# Patient Record
Sex: Male | Born: 1970
Health system: Southern US, Community
[De-identification: ages and names within clinical notes are randomized; demographics above are authoritative.]

## PROBLEM LIST (undated history)

## (undated) DIAGNOSIS — N529 Male erectile dysfunction, unspecified: Secondary | ICD-10-CM

## (undated) DIAGNOSIS — I10 Essential (primary) hypertension: Secondary | ICD-10-CM

## (undated) DIAGNOSIS — N189 Chronic kidney disease, unspecified: Secondary | ICD-10-CM

## (undated) DIAGNOSIS — W3400XA Accidental discharge from unspecified firearms or gun, initial encounter: Secondary | ICD-10-CM

## (undated) HISTORY — DX: Accidental discharge from unspecified firearms or gun, initial encounter: W34.00XA

## (undated) HISTORY — DX: Male erectile dysfunction, unspecified: N52.9

## (undated) HISTORY — PX: NO PAST SURGERIES: SHX2092

---

## 1999-03-03 ENCOUNTER — Emergency Department (HOSPITAL_COMMUNITY): Admission: EM | Admit: 1999-03-03 | Discharge: 1999-03-03 | Payer: Self-pay | Admitting: Emergency Medicine

## 2000-10-20 ENCOUNTER — Emergency Department (HOSPITAL_COMMUNITY): Admission: EM | Admit: 2000-10-20 | Discharge: 2000-10-20 | Payer: Self-pay | Admitting: Emergency Medicine

## 2004-06-05 ENCOUNTER — Emergency Department (HOSPITAL_COMMUNITY): Admission: EM | Admit: 2004-06-05 | Discharge: 2004-06-05 | Payer: Self-pay | Admitting: Emergency Medicine

## 2012-07-17 ENCOUNTER — Emergency Department (HOSPITAL_COMMUNITY)
Admission: EM | Admit: 2012-07-17 | Discharge: 2012-07-18 | Disposition: A | Discharge status: Home / Self Care | Payer: Self-pay | Attending: Emergency Medicine | Admitting: Emergency Medicine

## 2012-07-17 DIAGNOSIS — F17200 Nicotine dependence, unspecified, uncomplicated: Secondary | ICD-10-CM | POA: Insufficient documentation

## 2012-07-17 DIAGNOSIS — L739 Follicular disorder, unspecified: Secondary | ICD-10-CM

## 2012-07-17 DIAGNOSIS — L678 Other hair color and hair shaft abnormalities: Secondary | ICD-10-CM | POA: Insufficient documentation

## 2012-07-17 DIAGNOSIS — I10 Essential (primary) hypertension: Secondary | ICD-10-CM | POA: Insufficient documentation

## 2012-07-17 DIAGNOSIS — L738 Other specified follicular disorders: Secondary | ICD-10-CM | POA: Insufficient documentation

## 2012-07-18 ENCOUNTER — Encounter (HOSPITAL_COMMUNITY): Payer: Self-pay | Admitting: Family Medicine

## 2012-07-18 MED ORDER — DIPHENHYDRAMINE HCL 25 MG PO CAPS: 25.0000 mg | ORAL_CAPSULE | Freq: Four times a day (QID) | ORAL | Status: DC | PRN

## 2012-07-18 MED ORDER — CEPHALEXIN 250 MG PO CAPS
250.0000 mg | ORAL_CAPSULE | Freq: Once | ORAL | Status: AC
  Administered 2012-07-18: 250 mg via ORAL
  Filled 2012-07-18: qty 1

## 2012-07-18 MED ORDER — DIPHENHYDRAMINE HCL 25 MG PO CAPS
25.0000 mg | ORAL_CAPSULE | Freq: Once | ORAL | Status: AC
  Administered 2012-07-18: 25 mg via ORAL
  Filled 2012-07-18: qty 1

## 2012-07-18 MED ORDER — CEPHALEXIN 250 MG PO CAPS: 250.0000 mg | ORAL_CAPSULE | Freq: Three times a day (TID) | ORAL | Status: DC

## 2012-07-18 NOTE — ED Notes (Signed)
Patient states that he thinks he was bitten by something. States he had a bump a back on his back a week ago. Tonight he noticed that he has a bump on his left side and to his right inner thigh. States areas burn and itch.

## 2012-07-18 NOTE — ED Notes (Signed)
Pt states his BP has been high for past 3 years. He would like a follow up MD for BP

## 2012-07-18 NOTE — ED Provider Notes (Signed)
Medical screening examination/treatment/procedure(s) were performed by non-physician practitioner and as supervising physician I was immediately available for consultation/collaboration.  John-Adam Telesia Ates, M.D.  John-Adam Rodnisha Blomgren, MD 07/18/12 0401 

## 2012-07-18 NOTE — ED Provider Notes (Signed)
   History    CSN: [id number] Arrival date & time 07/17/12  2253  First MD Initiated Contact with Patient 07/18/12 0249     Chief Complaint  Patient presents with  . Rash   (Consider location/radiation/quality/duration/timing/severity/associated sxs/prior Treatment) HPI Comments: This 42 year old male presents with a rash, it itchy, burning, and spreading in his inner thighs, groin, abdomen, and left flank is been going on for several weeks, just getting worse.  He has not taken any over-the-counter medication for symptom relief He, states he has not been around any animals.  He does not work outside of the house.  Patient is a 42 y.o. male presenting with rash. The history is provided by the patient.  Rash Pain location:  Suprapubic and periumbilical Pain quality: burning   Pain severity:  Mild Timing:  Constant Progression:  Worsening Associated symptoms: no chest pain, no fever and no shortness of breath    History reviewed. No pertinent past medical history. History reviewed. No pertinent past surgical history. No family history on file. History  Substance Use Topics  . Smoking status: Current Every Day Smoker -- 1.00 packs/day    Types: Cigarettes  . Smokeless tobacco: Not on file  . Alcohol Use: Yes     Comment: "A lot"    Review of Systems  Constitutional: Negative for fever.  Respiratory: Negative for shortness of breath.   Cardiovascular: Negative for chest pain.  Skin: Positive for rash. Negative for wound.  All other systems reviewed and are negative.    Allergies  Review of patient's allergies indicates no known allergies.  Home Medications   Current Outpatient Rx  Name  Route  Sig  Dispense  Refill  . cephALEXin (KEFLEX) 250 MG capsule   Oral   Take 1 capsule (250 mg total) by mouth 3 (three) times daily.   29 capsule   0   . diphenhydrAMINE (BENADRYL) 25 mg capsule   Oral   Take 1 capsule (25 mg total) by mouth every 6 (six) hours as needed for  itching.   30 capsule   0    BP 201/134  Pulse 64  Temp(Src) 98.6 F (37 C) (Oral)  Resp 16  Ht 5\' 9"  (1.753 m)  Wt 160 lb (72.576 kg)  BMI 23.62 kg/m2  SpO2 100% Physical Exam  Nursing note and vitals reviewed. Constitutional: He is oriented to person, place, and time. He appears well-developed and well-nourished.  HENT:  Head: Normocephalic.  Eyes: Pupils are equal, round, and reactive to light.  Neck: Normal range of motion.  Cardiovascular: Normal rate.   Pulmonary/Chest: Effort normal.  Abdominal: Soft.  Musculoskeletal: Normal range of motion. He exhibits no edema and no tenderness.  Neurological: He is alert and oriented to person, place, and time.  Skin: Rash noted.  The rash appears to be small, raised, red areas at the end of hair shafts consistent with a folliculitis    ED Course  Procedures (including critical care time) Labs Reviewed - No data to display No results found. 1. Folliculitis     MDM   We'll treat with Keflex by mouth 3 times a day for 10 days, as well as Benadryl for symptom  Arman Filter, NP 07/18/12 0345  Arman Filter, NP 07/18/12 0348  Arman Filter, NP 07/18/12 0348  Arman Filter, NP 07/18/12 [phone]

## 2012-07-28 ENCOUNTER — Emergency Department (HOSPITAL_COMMUNITY)
Admission: EM | Admit: 2012-07-28 | Discharge: 2012-07-28 | Disposition: A | Discharge status: Home / Self Care | Payer: Self-pay | Attending: Emergency Medicine | Admitting: Emergency Medicine

## 2012-07-28 ENCOUNTER — Encounter (HOSPITAL_COMMUNITY): Payer: Self-pay | Admitting: Emergency Medicine

## 2012-07-28 DIAGNOSIS — I10 Essential (primary) hypertension: Secondary | ICD-10-CM | POA: Insufficient documentation

## 2012-07-28 DIAGNOSIS — F17200 Nicotine dependence, unspecified, uncomplicated: Secondary | ICD-10-CM | POA: Insufficient documentation

## 2012-07-28 DIAGNOSIS — Z79899 Other long term (current) drug therapy: Secondary | ICD-10-CM | POA: Insufficient documentation

## 2012-07-28 LAB — URINALYSIS, ROUTINE W REFLEX MICROSCOPIC
Bilirubin Urine: NEGATIVE
Glucose, UA: NEGATIVE mg/dL
Hgb urine dipstick: NEGATIVE
Ketones, ur: NEGATIVE mg/dL
Leukocytes, UA: NEGATIVE
Nitrite: NEGATIVE
Protein, ur: NEGATIVE mg/dL
Specific Gravity, Urine: 1.014 (ref 1.005–1.030)
Urobilinogen, UA: 0.2 mg/dL (ref 0.0–1.0)
pH: 6 (ref 5.0–8.0)

## 2012-07-28 LAB — POCT I-STAT, CHEM 8
BUN: 11 mg/dL (ref 6–23)
Calcium, Ion: 1.19 mmol/L (ref 1.12–1.23)
Chloride: 106 mEq/L (ref 96–112)
Creatinine, Ser: 1.2 mg/dL (ref 0.50–1.35)
Glucose, Bld: 74 mg/dL (ref 70–99)
HCT: 44 % (ref 39.0–52.0)
Hemoglobin: 15 g/dL (ref 13.0–17.0)
Potassium: 4.1 mEq/L (ref 3.5–5.1)
Sodium: 143 mEq/L (ref 135–145)
TCO2: 26 mmol/L (ref 0–100)

## 2012-07-28 MED ORDER — HYDROCHLOROTHIAZIDE 25 MG PO TABS: 25.0000 mg | ORAL_TABLET | Freq: Every day | ORAL | Status: DC

## 2012-07-28 NOTE — Progress Notes (Signed)
[facility] provided patient with a [employer] and a list of primary care resources.

## 2012-07-28 NOTE — ED Provider Notes (Signed)
  CSN: [id number]     Arrival date & time 07/28/12  1136 History     First MD Initiated Contact with Patient 07/28/12 1301     Chief Complaint  Patient presents with  . Hypertension   (Consider location/radiation/quality/duration/timing/severity/associated sxs/prior Treatment) HPI Pt presenting with c/o hypertension.  He states he was going to [facility] for appointment today to have blood pressure checked and initiate treatment for HTN.  He states that his blood pressure was elevated when he arrived and he was advised to go the ED.  He has no symptoms.  No chest pain or sob.  No weakness of extremities.  No changes in vision or speech.  No leg swelling.  He is not currently on any blood pressure medications.  There are no other associated systemic symptoms, there are no other alleviating or modifying factors.   History reviewed. No pertinent past medical history. History reviewed. No pertinent past surgical history. No family history on file. History  Substance Use Topics  . Smoking status: Current Every Day Smoker -- 1.00 packs/day    Types: Cigarettes  . Smokeless tobacco: Not on file  . Alcohol Use: Yes     Comment: "A lot"    Review of Systems ROS reviewed and all otherwise negative except for mentioned in HPI  Allergies  Review of patient's allergies indicates no known allergies.  Home Medications   Current Outpatient Rx  Name  Route  Sig  Dispense  Refill  . diphenhydrAMINE (BENADRYL) 25 mg capsule   Oral   Take 25 mg by mouth every 6 (six) hours as needed for itching.         . hydrochlorothiazide (HYDRODIURIL) 25 MG tablet   Oral   Take 1 tablet (25 mg total) by mouth daily.   30 tablet   0    BP 171/105  Pulse 66  Temp(Src) 98 F (36.7 C) (Oral)  Resp 12  SpO2 100% Vitals reviewed Physical Exam Physical Examination: General appearance - alert, well appearing, and in no distress Mental status - alert, oriented to person, place, and time Eyes  - pupils equal and reactive, extraocular eye movements intact Mouth - mucous membranes moist, pharynx normal without lesions Chest - clear to auscultation, no wheezes, rales or rhonchi, symmetric air entry Heart - normal rate, regular rhythm, normal S1, S2, no murmurs, rubs, clicks or gallops Abdomen - soft, nontender, nondistended, no masses or organomegaly Neurological - alert, oriented, normal speech, no cranial nerve deficit, strength 5/5 in extremities x 4, sensation intact Extremities - peripheral pulses normal, no pedal edema, no clubbing or cyanosis Skin - normal coloration and turgor, no rashes  ED Course   Procedures (including critical care time)   Date: 07/28/2012  Rate: 53  Rhythm: sinus bradycardia  QRS Axis: normal  Intervals: normal  ST/T Wave abnormalities: normal  Conduction Disutrbances: none  Narrative Interpretation: unremarkable, no prior ekg for comparison     Labs Reviewed  URINALYSIS, ROUTINE W REFLEX MICROSCOPIC  POCT I-STAT, CHEM 8   No results found. 1. Hypertension     MDM  Pt presenting with asymptomatic hypertension. No signs or symptoms of end organ damage.  Pt given rx for HCTZ and strongly encouraged f/u with PMD.  Pt is agreeable with this plan.  Discussed strict return precautions.      Ethelda Chick, MD 07/28/12 [phone]

## 2012-07-28 NOTE — ED Notes (Signed)
Pt reports to being "sent from the clinic for high blood pressure" Pt denies headaches or any complaints at this time.

## 2016-08-23 ENCOUNTER — Emergency Department (HOSPITAL_COMMUNITY)
Admission: EM | Admit: 2016-08-23 | Discharge: 2016-08-23 | Disposition: A | Discharge status: Home / Self Care | Payer: Self-pay | Attending: Emergency Medicine | Admitting: Emergency Medicine

## 2016-08-23 ENCOUNTER — Encounter (HOSPITAL_COMMUNITY): Payer: Self-pay | Admitting: Emergency Medicine

## 2016-08-23 DIAGNOSIS — I10 Essential (primary) hypertension: Secondary | ICD-10-CM | POA: Insufficient documentation

## 2016-08-23 DIAGNOSIS — I160 Hypertensive urgency: Secondary | ICD-10-CM

## 2016-08-23 DIAGNOSIS — Z79899 Other long term (current) drug therapy: Secondary | ICD-10-CM | POA: Insufficient documentation

## 2016-08-23 DIAGNOSIS — F17210 Nicotine dependence, cigarettes, uncomplicated: Secondary | ICD-10-CM | POA: Insufficient documentation

## 2016-08-23 HISTORY — DX: Essential (primary) hypertension: I10

## 2016-08-23 LAB — COMPREHENSIVE METABOLIC PANEL
ALT: 13 U/L — ABNORMAL LOW (ref 17–63)
AST: 26 U/L (ref 15–41)
Albumin: 3.8 g/dL (ref 3.5–5.0)
Alkaline Phosphatase: 44 U/L (ref 38–126)
Anion gap: 6 (ref 5–15)
BUN: 9 mg/dL (ref 6–20)
CO2: 24 mmol/L (ref 22–32)
Calcium: 9.1 mg/dL (ref 8.9–10.3)
Chloride: 106 mmol/L (ref 101–111)
Creatinine, Ser: 1.34 mg/dL — ABNORMAL HIGH (ref 0.61–1.24)
GFR calc Af Amer: >60 mL/min (ref >60)
GFR calc non Af Amer: >60 mL/min (ref >60)
Glucose, Bld: 83 mg/dL (ref 65–99)
Potassium: 4 mmol/L (ref 3.5–5.1)
Sodium: 136 mmol/L (ref 135–145)
Total Bilirubin: 0.5 mg/dL (ref 0.3–1.2)
Total Protein: 6.6 g/dL (ref 6.5–8.1)

## 2016-08-23 LAB — CBC WITH DIFFERENTIAL/PLATELET
Basophils Absolute: 0 10*3/uL (ref 0.0–0.1)
Basophils Relative: 0 %
Eosinophils Absolute: 0.3 10*3/uL (ref 0.0–0.7)
Eosinophils Relative: 3 %
HCT: 42.8 % (ref 39.0–52.0)
Hemoglobin: 14.6 g/dL (ref 13.0–17.0)
Lymphocytes Relative: 29 %
Lymphs Abs: 2.9 10*3/uL (ref 0.7–4.0)
MCH: 32.8 pg (ref 26.0–34.0)
MCHC: 34.1 g/dL (ref 30.0–36.0)
MCV: 96.2 fL (ref 78.0–100.0)
Monocytes Absolute: 0.9 10*3/uL (ref 0.1–1.0)
Monocytes Relative: 10 %
Neutro Abs: 5.8 10*3/uL (ref 1.7–7.7)
Neutrophils Relative %: 58 %
Platelets: 281 10*3/uL (ref 150–400)
RBC: 4.45 MIL/uL (ref 4.22–5.81)
RDW: 13.3 % (ref 11.5–15.5)
WBC: 9.9 10*3/uL (ref 4.0–10.5)

## 2016-08-23 MED ORDER — AMLODIPINE BESYLATE 5 MG PO TABS
10.0000 mg | ORAL_TABLET | Freq: Once | ORAL | Status: AC
  Administered 2016-08-23: 10 mg via ORAL
  Filled 2016-08-23: qty 2

## 2016-08-23 MED ORDER — AMLODIPINE BESYLATE 10 MG PO TABS: 10.0000 mg | ORAL_TABLET | Freq: Every day | ORAL | 0 refills | Status: DC

## 2016-08-23 MED ORDER — HYDRALAZINE HCL 20 MG/ML IJ SOLN
10.0000 mg | Freq: Once | INTRAMUSCULAR | Status: AC
  Administered 2016-08-23: 10 mg via INTRAVENOUS
  Filled 2016-08-23: qty 1

## 2016-08-23 NOTE — ED Triage Notes (Signed)
Pt c/o HA since yesterday, states hes had high blood pressure and is supposed to be taking meds but isn't. Pt states he ran out of his meds years ago.

## 2016-08-23 NOTE — ED Provider Notes (Signed)
[facility] Provider Note   CSN: [id number] Arrival date & time: 08/23/16  0818     History   Chief Complaint Chief Complaint  Patient presents with  . Headache  . Hypertension    HPI Anthony Oleksy is a 46 y.o. male.  HPI   46 year old male presents with concern for headache and high blood pressures. Reports any noticed a slow onset of headache on Thursday rated about 3-4 out of 10, and found his blood pressure to be 210 systolic while at work. Reports he laid down and began to feel better.  His headache resolved, and this morning it returned. Reports that it began slowly again, was a throbbing in the front.  Reports that he knew his blood pressure was elevated and came in for evaluation and treatment. Reports he used to be on antihypertensive, but has not taken them in years. He does not currently have a primary care physician. Denies chest pain, shortness of breath, numbness, weakness, trouble walking, trouble talking, change in vision, nausea, vomiting. Denies any other recent illness. Reports he does drink alcohol regularly but has not since Wednesday, and smokes cigarettes. Denies cocaine and methamphetamine use.  Past Medical History:  Diagnosis Date  . Hypertension     There are no active problems to display for this patient.   History reviewed. No pertinent surgical history.     Home Medications    Prior to Admission medications   Medication Sig Start Date End Date Taking? Authorizing Provider  amLODipine (NORVASC) 10 MG tablet Take 1 tablet (10 mg total) by mouth daily. 08/23/16   Alvira Monday, MD  hydrochlorothiazide (HYDRODIURIL) 25 MG tablet Take 1 tablet (25 mg total) by mouth daily. Patient not taking: Reported on 08/23/2016 07/28/12   Phillis Haggis, MD    Family History No family history on file.  Social History Social History  Substance Use Topics  . Smoking status: Current Every Day Smoker    Packs/day: 1.00    Types: Cigarettes  .  Smokeless tobacco: Not on file  . Alcohol use Yes     Comment: "A lot"     Allergies   Patient has no known allergies.   Review of Systems Review of Systems  Constitutional: Negative for fever.  HENT: Negative for sore throat.   Eyes: Negative for visual disturbance.  Respiratory: Negative for shortness of breath.   Cardiovascular: Negative for chest pain.  Gastrointestinal: Negative for abdominal pain, nausea and vomiting.  Genitourinary: Negative for difficulty urinating.  Musculoskeletal: Negative for back pain and neck stiffness.  Skin: Negative for rash.  Neurological: Positive for headaches. Negative for syncope, facial asymmetry, speech difficulty, weakness and numbness.     Physical Exam Updated Vital Signs BP (!) 188/128   Pulse 61   Temp 98.1 F (36.7 C) (Oral)   Resp 15   Ht 5\' 9"  (1.753 m)   Wt 77.1 kg (170 lb)   SpO2 99%   BMI 25.10 kg/m   Physical Exam  Constitutional: He is oriented to person, place, and time. He appears well-developed and well-nourished. No distress.  HENT:  Head: Normocephalic and atraumatic.  Eyes: Conjunctivae and EOM are normal.  Neck: Normal range of motion.  Cardiovascular: Normal rate, regular rhythm, normal heart sounds and intact distal pulses.  Exam reveals no gallop and no friction rub.   No murmur heard. Pulmonary/Chest: Effort normal and breath sounds normal. No respiratory distress. He has no wheezes. He has no rales.  Abdominal: Soft. He  exhibits no distension. There is no tenderness. There is no guarding.  Musculoskeletal: He exhibits no edema.  Neurological: He is alert and oriented to person, place, and time. He has normal strength. No cranial nerve deficit or sensory deficit. Coordination normal. GCS eye subscore is 4. GCS verbal subscore is 5. GCS motor subscore is 6.  Skin: Skin is warm and dry. He is not diaphoretic.  Nursing note and vitals reviewed.    ED Treatments / Results  Labs (all labs ordered are  listed, but only abnormal results are displayed) Labs Reviewed  COMPREHENSIVE METABOLIC PANEL - Abnormal; Notable for the following:       Result Value   Creatinine, Ser 1.34 (*)    ALT 13 (*)    All other components within normal limits  CBC WITH DIFFERENTIAL/PLATELET    EKG  EKG Interpretation None       Radiology No results found.  Procedures Procedures (including critical care time)  Medications Ordered in ED Medications  hydrALAZINE (APRESOLINE) injection 10 mg (10 mg Intravenous Given 08/23/16 0950)  amLODipine (NORVASC) tablet 10 mg (10 mg Oral Given 08/23/16 0950)     Initial Impression / Assessment and Plan / ED Course  I have reviewed the triage vital signs and the nursing notes.  Pertinent labs & imaging results that were available during my care of the patient were reviewed by me and considered in my medical decision making (see chart for details).     46yo male with history of hypertension presents with concern of hypertension. On arrival to the ED, BP was up to 240/140.  Patient without current headache, no neurologic symptoms, no chest pain, no shortness of breath and have low suspicion for hypertensive emergencies including low suspicion for [facility], hypertensive encephalopathy, stroke, MI, aortic dissection, pulmonary edema.  No acute onset or severe headaceh earlier, was mild and resolved spontaneously.  BMP was checked showing normal GFR.  Provided patient with IV hydralazine given significant elevation, and dose of amlodipine. Blood pressure down to 180s/120s.  Given rx for amlodipine 10mg .  Discussed importance of close primary care follow up, smoking cessation, and reasons to return to the ED in detail. Patient discharged in stable condition with understanding of reasons to return.    Final Clinical Impressions(s) / ED Diagnoses   Final diagnoses:  Hypertensive urgency    New Prescriptions New Prescriptions   AMLODIPINE (NORVASC) 10 MG TABLET    Take 1  tablet (10 mg total) by mouth daily.     Alvira Monday, MD 08/23/16 1053

## 2016-10-09 ENCOUNTER — Ambulatory Visit: Payer: Self-pay | Admitting: Internal Medicine

## 2016-10-15 ENCOUNTER — Ambulatory Visit (INDEPENDENT_AMBULATORY_CARE_PROVIDER_SITE_OTHER): Payer: Self-pay | Admitting: Internal Medicine

## 2016-10-15 ENCOUNTER — Encounter: Payer: Self-pay | Admitting: Internal Medicine

## 2016-10-15 DIAGNOSIS — F1010 Alcohol abuse, uncomplicated: Secondary | ICD-10-CM

## 2016-10-15 DIAGNOSIS — M795 Residual foreign body in soft tissue: Secondary | ICD-10-CM

## 2016-10-15 DIAGNOSIS — Z720 Tobacco use: Secondary | ICD-10-CM

## 2016-10-15 DIAGNOSIS — I10 Essential (primary) hypertension: Secondary | ICD-10-CM | POA: Insufficient documentation

## 2016-10-15 MED ORDER — LOSARTAN POTASSIUM 50 MG PO TABS: 50.0000 mg | ORAL_TABLET | Freq: Every day | ORAL | 0 refills | Status: DC

## 2016-10-15 MED ORDER — HYDROCHLOROTHIAZIDE 25 MG PO TABS: 25.0000 mg | ORAL_TABLET | Freq: Every day | ORAL | 3 refills | Status: DC

## 2016-10-15 NOTE — Progress Notes (Signed)
   [facility] Noralee Chars, MD Phone: [phone]  Reason For Visit: New patient, pmhx significant for HTN   # CHRONIC HTN: family hx of chronic HTN and stroke. Currently does not take medications regularly for his blood pressure. States that he has erectile dysfunction associated with taking Norvasc Current Meds - Norvasc, does not take hydrochlorothiazide 2/2 lack of  Prescription Lifestyle - does smoke and drink significantly is interested on cutting back on both of these Denies CP, dyspnea, HA, edema, dizziness / lightheadedness  # Tobacco Abuse  Smokes about a pack every 3 days. Has been smoking since he 46 years old -Patient is interested in quitting smoking in the future -However he would like to get his blood pressure under control first  # Alcohol use disorder  - patient drinks about 2 beers a day. He states on the weekend, on Saturday night he'll drink a 6 pack of beer. - denies his alcohol intake ever hindering his job or family relationships - Denies having an eye opener in the AM  --Denies any history of liver problems  Past Medical History Reviewed problem list.  Medications- reviewed and updated No additions to family history Social history- patient is a smoker  Objective: BP (!) 170/122   Pulse 77   Temp 98.7 F (37.1 C) (Oral)   Ht  (1.753 m)   Wt 167 lb (75.8 kg)   SpO2 99%   BMI 24.66 kg/m  Gen: NAD, alert, cooperative with exam HEENT: Normal    Neck: No masses palpated. No lymphadenopathy    Ears: Tympanic membranes intact, normal light reflex, no erythema, no bulging    Eyes: PERRLA, EOMI    Nose: nasal turbinates moist    Throat: moist mucus membranes, no erythema Cardio: regular rate and rhythm, S1S2 heard, no murmurs appreciated Pulm: clear to auscultation bilaterally, no wheezes, rhonchi or rales GI: soft, non-tender, non-distended, bowel sounds present, no hepatomegaly, no splenomegaly Extremities: warm, well  perfused, No edema, cyanosis or clubbing;  MSK: Normal gait and station Skin: dry, intact, no rashes or lesions Neuro: Strength and sensation grossly intact   Assessment/Plan: See problem based a/p  Essential hypertension Going to start patient on hydrochlorothiazide and losartan Follow-up in 2 weeks for BMET Obtain BMET and lipid panel today   Tobacco abuse Will discuss patient's tobacco use again in the near future as he is interested in the possibility of quitting  Alcohol use disorder, mild, abuse Patient with significant history of alcohol use, drinks more than 14 drinks a week -no signs of addiction per patient -Consider CMET in the future

## 2016-10-15 NOTE — Patient Instructions (Addendum)
I'm going to start you on 2 medications hydrochlorothiazide and losartan. You need to follow up with me in 2 weeks. I want you to think about changing some of habits we discussed, I will ask you about them again in the future.

## 2016-10-18 NOTE — Assessment & Plan Note (Signed)
Patient with significant history of alcohol use, drinks more than 14 drinks a week -no signs of addiction per patient -Consider CMET in the future

## 2016-10-18 NOTE — Assessment & Plan Note (Signed)
Will discuss patient's tobacco use again in the near future as he is interested in the possibility of quitting

## 2016-10-18 NOTE — Assessment & Plan Note (Addendum)
Going to start patient on hydrochlorothiazide and losartan Follow-up in 2 weeks for BMET Obtain BMET and lipid panel today

## 2016-10-20 LAB — BASIC METABOLIC PANEL

## 2016-10-20 LAB — LIPID PANEL

## 2017-05-10 ENCOUNTER — Other Ambulatory Visit: Payer: Self-pay

## 2017-05-10 DIAGNOSIS — I10 Essential (primary) hypertension: Secondary | ICD-10-CM

## 2017-05-10 MED ORDER — LOSARTAN POTASSIUM 50 MG PO TABS: 50.0000 mg | ORAL_TABLET | Freq: Every day | ORAL | 0 refills | Status: DC

## 2017-05-10 MED ORDER — HYDROCHLOROTHIAZIDE 25 MG PO TABS: 25.0000 mg | ORAL_TABLET | Freq: Every day | ORAL | 0 refills | Status: DC

## 2017-05-13 ENCOUNTER — Encounter (HOSPITAL_COMMUNITY): Payer: Self-pay | Admitting: Family Medicine

## 2017-05-13 ENCOUNTER — Ambulatory Visit (HOSPITAL_COMMUNITY)
Admission: EM | Admit: 2017-05-13 | Discharge: 2017-05-13 | Disposition: A | Discharge status: Home / Self Care | Payer: Self-pay | Attending: Family Medicine | Admitting: Family Medicine

## 2017-05-13 DIAGNOSIS — M62838 Other muscle spasm: Secondary | ICD-10-CM

## 2017-05-13 MED ORDER — NAPROXEN 375 MG PO TABS: 375.0000 mg | ORAL_TABLET | Freq: Two times a day (BID) | ORAL | 0 refills | Status: DC

## 2017-05-13 MED ORDER — CYCLOBENZAPRINE HCL 5 MG PO TABS: 5.0000 mg | ORAL_TABLET | Freq: Every day | ORAL | 0 refills | Status: DC

## 2017-05-13 NOTE — Discharge Instructions (Signed)
Light and regularly activity. Please see neck exercises Muscle relaxer at night, do not take if drinking alcohol or driving as may cause drowsiness, do not take at work. Naproxen twice a day, take with food. Please continue to follow with your primary care provider for BP management and recheck of neck pain.

## 2017-05-13 NOTE — ED Triage Notes (Signed)
Pt here for left lateral neck pain. Reports that he may have slept wrong. No injury. He has been out of work due to this and increased BP. He started back on his meds Wednesday.
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
## 2017-05-27 ENCOUNTER — Ambulatory Visit (INDEPENDENT_AMBULATORY_CARE_PROVIDER_SITE_OTHER): Payer: Self-pay | Admitting: Internal Medicine

## 2017-05-27 ENCOUNTER — Other Ambulatory Visit: Payer: Self-pay

## 2017-05-27 ENCOUNTER — Encounter: Payer: Self-pay | Admitting: Internal Medicine

## 2017-05-27 VITALS — BP 152/80 | HR 78 | Temp 98.4°F | Ht 69.0 in | Wt 161.0 lb

## 2017-05-27 DIAGNOSIS — Z889 Allergy status to unspecified drugs, medicaments and biological substances status: Secondary | ICD-10-CM | POA: Insufficient documentation

## 2017-05-27 DIAGNOSIS — Z720 Tobacco use: Secondary | ICD-10-CM

## 2017-05-27 DIAGNOSIS — I10 Essential (primary) hypertension: Secondary | ICD-10-CM

## 2017-05-27 MED ORDER — LOSARTAN POTASSIUM 100 MG PO TABS: 100.0000 mg | ORAL_TABLET | Freq: Every day | ORAL | 4 refills | Status: DC

## 2017-05-27 MED ORDER — NICOTINE POLACRILEX 4 MG MT GUM: 4.0000 mg | CHEWING_GUM | OROMUCOSAL | 0 refills | Status: DC | PRN

## 2017-05-27 MED ORDER — CETIRIZINE HCL 10 MG PO CHEW: 10.0000 mg | CHEWABLE_TABLET | Freq: Every day | ORAL | 1 refills | Status: DC

## 2017-05-27 NOTE — Patient Instructions (Addendum)
Please follow up in 1 month for your blood pressure.   I am going to provide you with Nicotine gum. Please continue to try to limit smoking.  Call [facility] Telephone Service is available 24/7 toll-free at 1-800-QUIT-NOW [phone]).  Interpretation services available for many languages.  Spanish: 1-855-Dejelo-Ya [phone])  TTY: [phone]

## 2017-05-27 NOTE — Progress Notes (Signed)
   [facility] Noralee Chars, MD Phone: [phone]  Reason For Visit: Follow up   # CHRONIC HTN: Reports patient reports blood pressure 198/140 and 156/108.  Current Meds - HCTZ and losartan  Reports good compliance, took meds today. Tolerating well, w/o complaints. Lifestyle - very active, patient does go walking about 3 times a week  Patient has had some headaches recently Denies CP, dyspnea, edema, dizziness / lightheadedness  # Seasonal Allergies  - Patient states he has had watery eyes for years  - He has never seen a doctor about it, but believes this is allergies   # Tobacco Abuse  States he had decreased his tobacco use.  He used to take a pack a day.  However he is now using a pack every 3 days.  He would like to try to quit and is interested in trying the nicotine gum for this  #EtOH Abuse  States he is no longer drinking like he used too.  He states he drinks only 3 beers on the weekend otherwise he does not drink during the week at all.  Past Medical History Reviewed problem list.  Medications- reviewed and updated No additions to family history Social history- patient is a non- smoker  Objective: BP (!) 152/80   Pulse 78   Temp 98.4 F (36.9 C) (Oral)   Ht  (1.753 m)   Wt 161 lb (73 kg)   SpO2 98%   BMI 23.78 kg/m  Gen: NAD, alert, cooperative with exam HEENT: Normal    Eyes: Conjunctive watery, slight injected     Nose: nasal turbinates moist    Throat: moist mucus membranes, no erythema Cardio: regular rate and rhythm, S1S2 heard, no murmurs appreciated Pulm: clear to auscultation bilaterally, no wheezes, rhonchi or rales Skin: dry, intact, no rashes or lesions   Assessment/Plan: See problem based a/p  Essential hypertension Blood pressure not well controlled  - Continue HCTZ  - Will increase losartan  - Will obtain BMET  - Will look FMLA for doctors visit today   Tobacco abuse Interested in quitting  Use the  quit line  Provided Nicotine gum  Follow up in 1 month   Hx of seasonal allergies Watery eyes  - Will try Zyrtec for allergies

## 2017-05-27 NOTE — Assessment & Plan Note (Signed)
Interested in quitting  Use the quit line  Provided Nicotine gum  Follow up in 1 month

## 2017-05-27 NOTE — Assessment & Plan Note (Signed)
Blood pressure not well controlled  - Continue HCTZ  - Will increase losartan  - Will obtain BMET  - Will look FMLA for doctors visit today

## 2017-05-27 NOTE — Assessment & Plan Note (Signed)
Watery eyes  - Will try Zyrtec for allergies

## 2017-05-28 ENCOUNTER — Encounter: Payer: Self-pay | Admitting: Internal Medicine

## 2017-05-28 LAB — BASIC METABOLIC PANEL
BUN/Creatinine Ratio: 12 (ref 9–20)
BUN: 19 mg/dL (ref 6–24)
CO2: 25 mmol/L (ref 20–29)
Calcium: 10.1 mg/dL (ref 8.7–10.2)
Chloride: 106 mmol/L (ref 96–106)
Creatinine, Ser: 1.58 mg/dL — ABNORMAL HIGH (ref 0.76–1.27)
GFR calc Af Amer: 60 mL/min/{1.73_m2} (ref >59)
GFR calc non Af Amer: 52 mL/min/{1.73_m2} — ABNORMAL LOW (ref >59)
Glucose: 83 mg/dL (ref 65–99)
Potassium: 4.1 mmol/L (ref 3.5–5.2)
Sodium: 145 mmol/L — ABNORMAL HIGH (ref 134–144)

## 2017-06-03 ENCOUNTER — Telehealth: Payer: Self-pay | Admitting: Internal Medicine

## 2017-06-03 NOTE — Telephone Encounter (Signed)
Please let Mr. Bunte know that his paperwork is ready for pick up.

## 2017-06-03 NOTE — Telephone Encounter (Signed)
Pt informed. Anthony Randolph, CMA  

## 2017-06-28 ENCOUNTER — Ambulatory Visit (INDEPENDENT_AMBULATORY_CARE_PROVIDER_SITE_OTHER): Payer: BLUE CROSS/BLUE SHIELD | Admitting: Internal Medicine

## 2017-06-28 ENCOUNTER — Encounter: Payer: Self-pay | Admitting: Internal Medicine

## 2017-06-28 ENCOUNTER — Other Ambulatory Visit: Payer: Self-pay

## 2017-06-28 ENCOUNTER — Ambulatory Visit (HOSPITAL_COMMUNITY)
Admission: RE | Admit: 2017-06-28 | Discharge: 2017-06-28 | Disposition: A | Discharge status: Home / Self Care | Payer: BLUE CROSS/BLUE SHIELD | Source: Ambulatory Visit | Attending: Family Medicine | Admitting: Family Medicine

## 2017-06-28 VITALS — BP 125/80 | HR 89 | Temp 98.5°F | Ht 69.0 in | Wt 162.6 lb

## 2017-06-28 DIAGNOSIS — I10 Essential (primary) hypertension: Secondary | ICD-10-CM | POA: Diagnosis not present

## 2017-06-28 DIAGNOSIS — R079 Chest pain, unspecified: Secondary | ICD-10-CM

## 2017-06-28 DIAGNOSIS — R9431 Abnormal electrocardiogram [ECG] [EKG]: Secondary | ICD-10-CM | POA: Diagnosis not present

## 2017-06-28 NOTE — Assessment & Plan Note (Signed)
Given hx of more frequent episodes of chest pain, and risk factors including hypertension and smoking - will have patient follow-up with cardiology for further work-up -EKG performed today and similar to previous

## 2017-06-28 NOTE — Progress Notes (Signed)
   [facility] Noralee CharsAsiyah Reeta Kuk, MD Phone: [phone]  Reason For Visit:  Follow up   #CHRONIC HTN:  Reports blood pressure has been good at home  Current Meds - Losartan and HCTZ Reports good compliance, took meds today. Tolerating well, w/o complaints. Denies dyspnea, HA, edema, dizziness / lightheadedness Endorse chest pain  #Chest pain  - 1 years of having chest pain off and on the left side  - Indicates some pressure,  - Sometimes associated with nausea, such as today  - Not necessarily associated with activity, though notes it is worse with activity  - Feels better when patient rest  - Indicates 2-3 times a month  - Not associated  - No SOB - Smoking only about 3 cigarettes a day   Past Medical History Reviewed problem list.  Medications- reviewed and updated No additions to family history Social history- patient is a  smoker  Objective: BP 125/80 (BP Location: Left Arm, Patient Position: Sitting, Cuff Size: Normal)   Pulse 89   Temp 98.5 F (36.9 C) (Oral)   Ht 5\' 9"  (1.753 m)   Wt 162 lb 9.6 oz (73.8 kg)   SpO2 97%   BMI 24.01 kg/m  Gen: NAD, alert, cooperative with exam Cardio: regular rate and rhythm, S1S2 heard, no murmurs appreciated Pulm: clear to auscultation bilaterally, no wheezes, rhonchi or rales Extremities: warm, well perfused, No edema, cyanosis or clubbing;  Skin: dry, intact, no rashes or lesions    Assessment/Plan: See problem based a/p  Chest pain Given hx of more frequent episodes of chest pain, and risk factors including hypertension and smoking - will have patient follow-up with cardiology for further work-up -EKG performed today and similar to previous  Essential hypertension Blood pressure well controlled today  Continue losartan and HCTZ  Obtain BMET today  Follow up in 1 week

## 2017-06-28 NOTE — Patient Instructions (Signed)
Your blood pressure is looking great.  Given you have had these issues with chest pain over the past couple of months let have you follow-up with cardiology.  I have placed a referral for you to see them in the future.  I will fill out this FMLA form and fax it back soon as possible.

## 2017-06-28 NOTE — Assessment & Plan Note (Signed)
Blood pressure well controlled today  Continue losartan and HCTZ  Obtain BMET today  Follow up in 1 week

## 2017-06-29 LAB — BASIC METABOLIC PANEL
BUN/Creatinine Ratio: 7 — ABNORMAL LOW (ref 9–20)
BUN: 12 mg/dL (ref 6–24)
CO2: 28 mmol/L (ref 20–29)
Calcium: 10 mg/dL (ref 8.7–10.2)
Chloride: 101 mmol/L (ref 96–106)
Creatinine, Ser: 1.61 mg/dL — ABNORMAL HIGH (ref 0.76–1.27)
GFR calc Af Amer: 58 mL/min/{1.73_m2} — ABNORMAL LOW (ref >59)
GFR calc non Af Amer: 51 mL/min/{1.73_m2} — ABNORMAL LOW (ref >59)
Glucose: 85 mg/dL (ref 65–99)
Potassium: 4.5 mmol/L (ref 3.5–5.2)
Sodium: 143 mmol/L (ref 134–144)

## 2017-06-29 LAB — LIPID PANEL
Chol/HDL Ratio: 2.2 ratio (ref 0.0–5.0)
Cholesterol, Total: 147 mg/dL (ref 100–199)
HDL: 66 mg/dL (ref >39)
LDL Calculated: 63 mg/dL (ref 0–99)
Triglycerides: 92 mg/dL (ref 0–149)
VLDL Cholesterol Cal: 18 mg/dL (ref 5–40)

## 2017-06-30 ENCOUNTER — Encounter: Payer: Self-pay | Admitting: Internal Medicine

## 2017-07-20 ENCOUNTER — Telehealth: Payer: Self-pay

## 2017-07-20 NOTE — Telephone Encounter (Signed)
Patient recently picked up some FMLA forms filled out by Dr Cathlean CowerMikell. His employer states page 4 not complete.  These are scanned in his media section from 07/13/17. Can page 4 be printed and completed for him to pick up?  Call back is [phone]   Ples SpecterAlisa Faatima Tench, RN [facility] RN)

## 2017-08-02 ENCOUNTER — Telehealth: Payer: Self-pay

## 2017-08-02 NOTE — Telephone Encounter (Signed)
LMOV explaining to patient that the FMLA forms provided by him were only 4 pages long, so I am not sure what he means by page 7. If he calls back, will need to explain to him that I only have 4 pages of FMLA and he will need to provide the other pages to be filled out.   Thank you!!

## 2017-08-02 NOTE — Telephone Encounter (Signed)
Patient would like to talk to PCP about FMLA forms. Page 4 was previously refaxed and now employer states page 7 not filled out correctly.  Call back is [phone]  Ples SpecterAlisa Kyliyah Stirn, RN [facility] RN)

## 2017-08-05 NOTE — Telephone Encounter (Signed)
Patient calling to inform PCP that his FMLA was denied due to recent form not specifying his diagnosis or medical condition.  Will print form and have PCP complete #4 (page 3) regarding his medical diagnosis/condition in order for him to miss time from work for office visits.  Once completed, will need to fax updated FMLA papers to [employer] [place] [phone]).  Form placed in Dr. Mauri ReadingMullis' mailbox.  Altamese Dilling~Jeannette Richardson, BSN, RN-BC

## 2017-08-06 NOTE — Telephone Encounter (Signed)
Form was completed and placed to be faxed

## 2017-11-24 ENCOUNTER — Telehealth: Payer: Self-pay | Admitting: *Deleted

## 2017-11-24 NOTE — Telephone Encounter (Signed)
Pt has been checking his BPs @ work.  He reports his lasat one of 160/102 and states they have all been like this.  He reports the following  Chest pain - occasionally, more with movement Headaches - occasional Right forearm - tingling Blurry vision - occasional with fast position change  Advised that I will make an appt for tomorrow, but he will need to ED or call 911 if symptoms return. Trinity Hyland, Maryjo RochesterJessica Dawn, CMA

## 2017-11-24 NOTE — Telephone Encounter (Signed)
That sounds appropriate, thank you!

## 2017-11-25 ENCOUNTER — Ambulatory Visit (INDEPENDENT_AMBULATORY_CARE_PROVIDER_SITE_OTHER): Payer: BLUE CROSS/BLUE SHIELD | Admitting: Family Medicine

## 2017-11-25 ENCOUNTER — Other Ambulatory Visit: Payer: Self-pay

## 2017-11-25 ENCOUNTER — Ambulatory Visit (HOSPITAL_COMMUNITY)
Admission: RE | Admit: 2017-11-25 | Discharge: 2017-11-25 | Disposition: A | Discharge status: Home / Self Care | Payer: BLUE CROSS/BLUE SHIELD | Source: Ambulatory Visit | Attending: Family Medicine | Admitting: Family Medicine

## 2017-11-25 VITALS — BP 130/94 | HR 85 | Temp 98.9°F | Ht 69.0 in | Wt 165.0 lb

## 2017-11-25 DIAGNOSIS — N529 Male erectile dysfunction, unspecified: Secondary | ICD-10-CM | POA: Insufficient documentation

## 2017-11-25 DIAGNOSIS — Z720 Tobacco use: Secondary | ICD-10-CM | POA: Diagnosis not present

## 2017-11-25 DIAGNOSIS — R079 Chest pain, unspecified: Secondary | ICD-10-CM | POA: Insufficient documentation

## 2017-11-25 DIAGNOSIS — I10 Essential (primary) hypertension: Secondary | ICD-10-CM | POA: Diagnosis not present

## 2017-11-25 MED ORDER — ATORVASTATIN CALCIUM 40 MG PO TABS: 40.0000 mg | ORAL_TABLET | Freq: Every day | ORAL | 3 refills | Status: DC

## 2017-11-25 MED ORDER — NITROGLYCERIN 0.4 MG SL SUBL: 0.4000 mg | SUBLINGUAL_TABLET | SUBLINGUAL | 3 refills | Status: DC | PRN

## 2017-11-25 MED ORDER — ASPIRIN EC 81 MG PO TBEC: 81.0000 mg | DELAYED_RELEASE_TABLET | Freq: Every day | ORAL | 1 refills | Status: DC

## 2017-11-25 MED ORDER — METOPROLOL SUCCINATE ER 25 MG PO TB24: 25.0000 mg | ORAL_TABLET | Freq: Every day | ORAL | 3 refills | Status: DC

## 2017-11-25 NOTE — Assessment & Plan Note (Signed)
  Referral made to urology for further evaluation.

## 2017-11-25 NOTE — Assessment & Plan Note (Signed)
  Stressed importance of cessation. Patient has cut down from 1 pack a day to 5 cigs a day. Praised efforts. Will continue to address

## 2017-11-25 NOTE — Assessment & Plan Note (Signed)
  Not at goal today but patietn did not take HCTZ today. Asymptomatic. Will add metop succinate and follow up 2 weeks with me.

## 2017-11-25 NOTE — Assessment & Plan Note (Signed)
  Urgent referral to cardiology made. In office EKG with a lot of artifact as patient had vaseline on chest and CMAs had difficulty obtaining EKG. He does not appear to have acute ST changes on EKG. He is without chest pain in the office. He has a strong FH. I have sent in rx for SL nitro, metop succinate, atorvastatin, and baby aspirin. Strict ED precautions reviewed. Work form filled out. Patient verbalized understanding and agreement with plan.

## 2017-11-25 NOTE — Progress Notes (Signed)
    Subjective:    Patient ID: Anthony Randolph, male    DOB: Dec 20, 1970, 47 y.o.   MRN: [id number]   CC: elevated BP, intermittent chest pain  HPI: has been checking BP at work and it is elevated at times >160/>100. He reports intermittent chest pain, non currently but did have some this morning. He also has intermittent blurred vision and a tingling sensation in his right arm at times. Takes HCTZ every morning and losartan every evening. Did not take HCTZ this morning.  Exertional chest pain- this has been ongoing for several months now. Works at [employer] of company and will have chest pain intermittently at work. His job asked him to be seen by a physician and take time off work to get this sorted out. He last had chest pain this morning laying in bed. He describes it as central, lasting <5 minutes. The chest pain at work resolves with rest.   FH- mom and brother with CAD, brother passed away at 942 from heart issues  ED- he also is concerned about his prostate and difficulties "performing" at times. He is interested in seeing urology. No FH of prostate cancer but endorses difficulty emptying bladder.  Smoking status reviewed- smokes 5 cigarettes a day down from 1 pack a day  Review of Systems- see HPI   Objective:  BP (!) 130/94 (BP Location: Right Arm, Patient Position: Sitting, Cuff Size: Large)   Pulse 85   Temp 98.9 F (37.2 C) (Oral)   Ht 5\' 9"  (1.753 m)   Wt 165 lb (74.8 kg)   SpO2 97%   BMI 24.37 kg/m  Vitals and nursing note reviewed  General: well appearing and well nourished, in no acute distress HEENT: normocephalic, MMM Cardiac: RRR, clear S1 and S2, no murmurs, rubs, or gallops, +2 radial pulses bilaterally  Respiratory: clear to auscultation bilaterally, no increased work of breathing Extremities: no edema or cyanosis Neuro: alert and oriented, no focal deficits   Assessment & Plan:    Exertional chest pain  Urgent referral to cardiology made.  In office EKG with a lot of artifact as patient had vaseline on chest and CMAs had difficulty obtaining EKG. He does not appear to have acute ST changes on EKG. He is without chest pain in the office. He has a strong FH. I have sent in rx for SL nitro, metop succinate, atorvastatin, and baby aspirin. Strict ED precautions reviewed. Work form filled out. Patient verbalized understanding and agreement with plan.   Essential hypertension  Not at goal today but patietn did not take HCTZ today. Asymptomatic. Will add metop succinate and follow up 2 weeks with me.   Tobacco abuse  Stressed importance of cessation. Patient has cut down from 1 pack a day to 5 cigs a day. Praised efforts. Will continue to address  Erectile dysfunction  Referral made to urology for further evaluation.     Return in about 2 weeks (around 12/09/2017).   Dolores PattyAngela Brinn Westby, DO Family Medicine Resident PGY-3

## 2017-11-25 NOTE — Patient Instructions (Signed)
  Nice to see you today Please make every effort to quit smoking.  I have sent in medications to help treat your suspected heart disease and reduce risk of heart attack and/or stroke:  Metoprolol (take once a day), daily baby aspirin, cholesterol medication.  Please use the SL nitroglycerin if you experience chest pain. If you need to use this medication please proceed to the emergency room for evaluation.  I put in urgent referral for cardiology to evaluate your intermittent exertional chest pain. You will be called to make an appointment.  I also put in referral (routine) to evaluate your prostate and erectile dysfunction. This may take 4-6 weeks to schedule an appointment.  If you have questions or concerns please do not hesitate to call at [phone].  Anthony PattyAngela Larrell Rapozo, DO PGY-3, [facility] 11/25/2017 11:27 AM

## 2017-12-13 ENCOUNTER — Telehealth: Payer: Self-pay

## 2017-12-13 NOTE — Telephone Encounter (Signed)
Pt called nurse line this morning stating he saw Riccio on 11/21 and handed her a form that she filled out on behalf of his employer. Pt stated he turned the form in to HR and they have now lost it. Pt does not have a copy of this form and does not think Riccio made a copy either. Pt states at this point he just needs a letter stating he is to be out of work as of 11/21-12/20. Please advise.

## 2017-12-14 NOTE — Telephone Encounter (Signed)
Pt called to check status.  Spoke with Dr. Cherie Darkicco.  She needs to know what the cardiologist said before writing letter.   Contacted pt back.  He has yet to make the appt.  [facility] will [facility] now and schedule an appt and call us back with the info. Omkar Stratmann, Maryjo RochesterJessica Dawn, CMA

## 2017-12-14 NOTE — Telephone Encounter (Signed)
We can write him a note out until that appt and then patient really should be cleared to go back to work by cardiology so they can write him additional time out if he needs it or just clear him to return

## 2017-12-14 NOTE — Telephone Encounter (Signed)
Letter created and placed up front for pick up. VM was left informing patient.

## 2017-12-14 NOTE — Telephone Encounter (Signed)
Pt called back- he has apt schedule with [facility]. Informed patient a letter can be written pending cardiology recommendations.

## 2017-12-15 ENCOUNTER — Other Ambulatory Visit: Payer: Self-pay | Admitting: Internal Medicine

## 2017-12-15 DIAGNOSIS — I10 Essential (primary) hypertension: Secondary | ICD-10-CM

## 2017-12-17 ENCOUNTER — Encounter: Payer: Self-pay | Admitting: Cardiology

## 2017-12-20 ENCOUNTER — Encounter: Payer: Self-pay | Admitting: *Deleted

## 2017-12-20 ENCOUNTER — Ambulatory Visit: Payer: BLUE CROSS/BLUE SHIELD | Admitting: Cardiology

## 2017-12-20 ENCOUNTER — Encounter: Payer: Self-pay | Admitting: Cardiology

## 2017-12-20 VITALS — Ht 69.0 in | Wt 166.4 lb

## 2017-12-20 DIAGNOSIS — R0789 Other chest pain: Secondary | ICD-10-CM | POA: Diagnosis not present

## 2017-12-20 DIAGNOSIS — R5383 Other fatigue: Secondary | ICD-10-CM | POA: Diagnosis not present

## 2017-12-20 DIAGNOSIS — I10 Essential (primary) hypertension: Secondary | ICD-10-CM | POA: Diagnosis not present

## 2017-12-20 NOTE — Patient Instructions (Signed)
Medication Instructions:  Your physician recommends that you continue on your current medications as directed. Please refer to the Current Medication list given to you today.  If you need a refill on your cardiac medications before your next appointment, please call your pharmacy.   Lab work: None If you have labs (blood work) drawn today and your tests are completely normal, you will receive your results only by: Marland Kitchen. MyChart Message (if you have MyChart) OR . A paper copy in the mail If you have any lab test that is abnormal or we need to change your treatment, we will call you to review the results.  Testing/Procedures: Your physician has requested that you have en exercise stress myoview. For further information please visit [email or web address]. Please follow instruction sheet, as given.   Follow-Up: Your physician recommends that you schedule a follow-up appointment as needed with Dr. Anne FuSkains.   Any Other Special Instructions Will Be Listed Below (If Applicable).

## 2017-12-20 NOTE — Progress Notes (Signed)
Cardiology Office Note:    Date:  12/20/2017   ID:  Anthony Randolph, DOB 07-16-1970, MRN [id number]  PCP:  [name], DO  Cardiologist:  No primary care provider on file.  Electrophysiologist:  None   Referring MD: Tillman Sers, DO    History of Present Illness:    Anthony Randolph is a 47 y.o. male here for the evaluation of chest pain at the request of Dr. Wonda Olds  His blood pressure has been at times in the 160/100 range and has been reporting some intermittent chest discomfort blurring vision tingling in his right arm.  In review of office note from 11/25/2017 he had been experiencing exertional chest pain over the past several months.  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  Her job stated that he should be seen by a physician.  When he was laying in bed in the morning prior to that office visit he described central chest discomfort lasting a couple minutes duration.  Resolved with rest. Sometimes when raise left arm can feel it.   He does have a strong family history with his brother dying at age 60 from heart issues just 3 months ago. Stress at home.  His mother also had CAD.  He smokes approximately 5 cigarettes a day to 1 pack a day.  Past Medical History:  Diagnosis Date  . Hypertension     History reviewed. No pertinent surgical history.  Current Medications: Current Meds  Medication Sig  . aspirin EC 81 MG tablet Take 1 tablet (81 mg total) by mouth daily.  . hydrochlorothiazide (HYDRODIURIL) 25 MG tablet Take 1 tablet (25 mg total) by mouth daily.  Marland Kitchen losartan (COZAAR) 100 MG tablet Take 1 tablet (100 mg total) by mouth daily. NEEDS REPEAT LABS BEFORE FUTURE REFILLS APPROVED.     Allergies:   Patient has no known allergies.   Social History   Socioeconomic History  . Marital status: Single    Spouse name: Not on file  . Number of children: Not on file  . Years of education: Not on  file  . Highest education level: Not on file  Occupational History  . Not on file  Social Needs  . Financial resource strain: Not on file  . Food insecurity:    Worry: Not on file    Inability: Not on file  . Transportation needs:    Medical: Not on file    Non-medical: Not on file  Tobacco Use  . Smoking status: Current Every Day Smoker    Packs/day: 0.50    Types: Cigarettes  . Smokeless tobacco: Never Used  Substance and Sexual Activity  . Alcohol use: Yes    Comment: 2 beers daily, drinks a whole six pack on saturday   . Drug use: Yes    Types: Marijuana    Comment: occasional   . Sexual activity: Yes  Lifestyle  . Physical activity:    Days per week: Not on file    Minutes per session: Not on file  . Stress: Not on file  Relationships  . Social connections:    Talks on phone: Not on file    Gets together: Not on file    Attends religious service: Not on file    Active member of club or organization: Not on file    Attends meetings of clubs or organizations: Not on file    Relationship status: Not on file  Other Topics Concern  . Not on file  Social History Narrative   Makes gas pumps. Patient children 5723, 3714, 46 years old. Male partner, sexual active     Family History: The patient's family history includes Hypertension in his brother, maternal uncle, and mother; Stroke in his brother and maternal uncle; Thyroid disease in his mother.  ROS:   Please see the history of present illness.    Positive for back pain chest pain fatigue headaches dizziness all other systems reviewed and are negative.  EKGs/Labs/Other Studies Reviewed:    The following studies were reviewed today: Prior office notes lab work EKG  EKG:  EKG is not ordered today. ECG shows sinus rhythm with nonspecific ST-T wave changes previous.  Personally reviewed  Recent Labs: 06/28/2017: BUN 12; Creatinine, Ser 1.61; Potassium 4.5; Sodium 143  Recent Lipid Panel    Component Value  Date/Time   CHOL 147 06/28/2017 1622   TRIG 92 06/28/2017 1622   HDL 66 06/28/2017 1622   CHOLHDL 2.2 06/28/2017 1622   LDLCALC 63 06/28/2017 1622    Physical Exam:    VS:  Ht 5\' 9"  (1.753 m)   Wt 166 lb 6.4 oz (75.5 kg)   SpO2 97%   BMI 24.57 kg/m     Wt Readings from Last 3 Encounters:  12/20/17 166 lb 6.4 oz (75.5 kg)  11/25/17 165 lb (74.8 kg)  06/28/17 162 lb 9.6 oz (73.8 kg)     GEN:  Well nourished, well developed in no acute distress HEENT: Normal NECK: No JVD; No carotid bruits LYMPHATICS: No lymphadenopathy CARDIAC: RRR, no murmurs, rubs, gallops RESPIRATORY:  Clear to auscultation without rales, wheezing or rhonchi  ABDOMEN: Soft, non-tender, non-distended MUSCULOSKELETAL:  No edema; No deformity  SKIN: Warm and dry NEUROLOGIC:  Alert and oriented x 3 PSYCHIATRIC:  Normal affect   ASSESSMENT:    1. Other chest pain   2. Other fatigue   3. Essential hypertension    PLAN:    In order of problems listed above:  Chest discomfort - With tobacco use, hypertension, strong family history with mother and brother both with CAD, we will go ahead and proceed with nuclear stress test to make sure he does not have any high risk ischemia.  Ultimately, he does have some atypical features with this chest discomfort, for instance when he raises his left arm above his head sometimes he will feel some discomfort.  Sometimes he feels the discomfort when he is laying in bed.  Nonetheless, I think a stress test makes good sense.  Essential hypertension - Elevated today.  Encouraged him to continue with the medications.  He will continue to work with his primary team.  Dizziness -We did check orthostatics.  They were normal.  He is not having any dizziness secondary to decreases in blood pressure.  Continue to treat hypertension.    Medication Adjustments/Labs and Tests Ordered: Current medicines are reviewed at length with the patient today.  Concerns regarding medicines  are outlined above.  Orders Placed This Encounter  Procedures  . MYOCARDIAL PERFUSION IMAGING   No orders of the defined types were placed in this encounter.   Patient Instructions  Medication Instructions:  Your physician recommends that you continue on your current medications as directed. Please refer to the Current Medication list given to you today.  If you need a refill on your cardiac medications before your next appointment, please call your pharmacy.   Lab work: None If you have labs (  blood work) drawn today and your tests are completely normal, you will receive your results only by: Marland Kitchen MyChart Message (if you have MyChart) OR . A paper copy in the mail If you have any lab test that is abnormal or we need to change your treatment, we will call you to review the results.  Testing/Procedures: Your physician has requested that you have en exercise stress myoview. For further information please visit [email or web address] Please follow instruction sheet, as given.   Follow-Up: Your physician recommends that you schedule a follow-up appointment as needed with Dr. Anne Fu.   Any Other Special Instructions Will Be Listed Below (If Applicable).       Signed, Donato Schultz, MD  12/20/2017 4:39 PM    [facility]

## 2017-12-22 ENCOUNTER — Telehealth (HOSPITAL_COMMUNITY): Payer: Self-pay | Admitting: *Deleted

## 2017-12-22 NOTE — Telephone Encounter (Signed)
Patient given detailed instructions per Myocardial Perfusion Study Information Sheet for the test on 8:00 at 12/24/17. Patient notified to arrive 15 minutes early and that it is imperative to arrive on time for appointment to keep from having the test rescheduled.  If you need to cancel or reschedule your appointment, please call the office within 24 hours of your appointment. . Patient verbalized understanding.Daneil DolinSharon S Brooks

## 2017-12-24 ENCOUNTER — Ambulatory Visit (HOSPITAL_COMMUNITY): Payer: BLUE CROSS/BLUE SHIELD | Attending: Internal Medicine

## 2017-12-24 DIAGNOSIS — I10 Essential (primary) hypertension: Secondary | ICD-10-CM | POA: Insufficient documentation

## 2017-12-24 DIAGNOSIS — R5383 Other fatigue: Secondary | ICD-10-CM | POA: Insufficient documentation

## 2017-12-24 DIAGNOSIS — R0789 Other chest pain: Secondary | ICD-10-CM | POA: Diagnosis not present

## 2017-12-24 LAB — MYOCARDIAL PERFUSION IMAGING
LV dias vol: 91 mL (ref 62–150)
LV sys vol: 42 mL
Peak HR: 127 {beats}/min
Rest HR: 65 {beats}/min
SDS: 1
SRS: 0
SSS: 1
TID: 0.93

## 2017-12-24 MED ORDER — REGADENOSON 0.4 MG/5ML IV SOLN
0.4000 mg | Freq: Once | INTRAVENOUS | Status: AC
  Administered 2017-12-24: 0.4 mg via INTRAVENOUS

## 2017-12-24 MED ORDER — TECHNETIUM TC 99M TETROFOSMIN IV KIT
9.2000 | PACK | Freq: Once | INTRAVENOUS | Status: AC | PRN
  Administered 2017-12-24: 9.2 via INTRAVENOUS
  Filled 2017-12-24: qty 10

## 2017-12-24 MED ORDER — TECHNETIUM TC 99M TETROFOSMIN IV KIT
25.2000 | PACK | Freq: Once | INTRAVENOUS | Status: AC | PRN
  Administered 2017-12-24: 25.2 via INTRAVENOUS
  Filled 2017-12-24: qty 26

## 2018-01-14 ENCOUNTER — Encounter: Payer: Self-pay | Admitting: Family Medicine

## 2018-01-14 ENCOUNTER — Ambulatory Visit (INDEPENDENT_AMBULATORY_CARE_PROVIDER_SITE_OTHER): Payer: BLUE CROSS/BLUE SHIELD | Admitting: Family Medicine

## 2018-01-14 ENCOUNTER — Other Ambulatory Visit: Payer: Self-pay

## 2018-01-14 VITALS — BP 130/88 | HR 77 | Temp 99.0°F | Ht 69.0 in | Wt 165.6 lb

## 2018-01-14 DIAGNOSIS — Z720 Tobacco use: Secondary | ICD-10-CM

## 2018-01-14 DIAGNOSIS — I10 Essential (primary) hypertension: Secondary | ICD-10-CM

## 2018-01-14 DIAGNOSIS — R079 Chest pain, unspecified: Secondary | ICD-10-CM

## 2018-01-14 NOTE — Assessment & Plan Note (Signed)
  Patient has cut down to 2 cigarettes a day. Praised efforts. Information given on more tips to quit totally. Follow up 2 months to check in.

## 2018-01-14 NOTE — Assessment & Plan Note (Signed)
  Stress test "excellent" per cardiology, no ischemia. Note given for patient to return to work on light duty 8 hour shifts. Follow up 2 months

## 2018-01-14 NOTE — Assessment & Plan Note (Signed)
  Well controlled today at goal <140/90, continue losartan and HCTZ. Follow up with PCP 2 months to recheck.

## 2018-01-14 NOTE — Patient Instructions (Signed)
Nice to see you today! Keep up the efforts to quit smoking. Please see your primary care doctor (Dr. Mauri Reading) in about 2 months to check in.  If you have questions or concerns please do not hesitate to call at [phone].  Dolores Patty, DO PGY-3, [facility] 01/14/2018 2:22 PM   Steps to Quit Smoking  Smoking tobacco can be bad for your health. It can also affect almost every organ in your body. Smoking puts you and people around you at risk for many serious long-lasting (chronic) diseases. Quitting smoking is hard, but it is one of the best things that you can do for your health. It is never too late to quit. What are the benefits of quitting smoking? When you quit smoking, you lower your risk for getting serious diseases and conditions. They can include:  Lung cancer or lung disease.  Heart disease.  Stroke.  Heart attack.  Not being able to have children (infertility).  Weak bones (osteoporosis) and broken bones (fractures). If you have coughing, wheezing, and shortness of breath, those symptoms may get better when you quit. You may also get sick less often. If you are pregnant, quitting smoking can help to lower your chances of having a baby of low birth weight. What can I do to help me quit smoking? Talk with your doctor about what can help you quit smoking. Some things you can do (strategies) include:  Quitting smoking totally, instead of slowly cutting back how much you smoke over a period of time.  Going to in-person counseling. You are more likely to quit if you go to many counseling sessions.  Using resources and support systems, such as: ? Agricultural engineer with a Veterinary surgeon. ? Phone quitlines. ? Automotive engineer. ? Support groups or group counseling. ? Text messaging programs. ? Mobile phone apps or applications.  Taking medicines. Some of these medicines may have nicotine in them. If you are pregnant or breastfeeding, do not take any  medicines to quit smoking unless your doctor says it is okay. Talk with your doctor about counseling or other things that can help you. Talk with your doctor about using more than one strategy at the same time, such as taking medicines while you are also going to in-person counseling. This can help make quitting easier. What things can I do to make it easier to quit? Quitting smoking might feel very hard at first, but there is a lot that you can do to make it easier. Take these steps:  Talk to your family and friends. Ask them to support and encourage you.  Call phone quitlines, reach out to support groups, or work with a Veterinary surgeon.  Ask people who smoke to not smoke around you.  Avoid places that make you want (trigger) to smoke, such as: ? Bars. ? Parties. ? Smoke-break areas at work.  Spend time with people who do not smoke.  Lower the stress in your life. Stress can make you want to smoke. Try these things to help your stress: ? Getting regular exercise. ? Deep-breathing exercises. ? Yoga. ? Meditating. ? Doing a body scan. To do this, close your eyes, focus on one area of your body at a time from head to toe, and notice which parts of your body are tense. Try to relax the muscles in those areas.  Download or buy apps on your mobile phone or tablet that can help you stick to your quit plan. There are many free apps, such  as QuitGuide from the [employer] developer for Disease Control and Prevention). You can find more support from smokefree.gov and other websites. This information is not intended to replace advice given to you by your health care provider. Make sure you discuss any questions you have with your health care provider. Document Released: 10/18/2008 Document Revised: 08/20/2015 Document Reviewed: 05/08/2014 Elsevier Interactive Patient Education  2019 [employer].

## 2018-01-14 NOTE — Progress Notes (Signed)
    Subjective:    Patient ID: Anthony Randolph, male    DOB: July 11, 1970, 48 y.o.   MRN: [id number]   CC: follow up exertional chest pain  HPI: Patient was having exertional chest pain at work with elevated BP. He saw me 11/21 and I referred him to cardiology who saw him 12/16 and did stress test 12/20. From review of stress test results were normal, no ischemia.   Today he is reporting he feels well. He is pleased with his blood pressure today. He is taking medications as prescribed. He has not had further episodes of chest pain but he has also not returned to work yet. He feels ready to go back but is worried about the 10-12 hour shifts of heavy lifting. He is requesting to be put on light duty with reduced hours.  He is smoking still but has cut down to 2 cigarettes a day. He states this whole ordeal was a big wake up call to him and he wants to quit totally. He states he has started the find the taste of cigarettes to be repulsive. He feels winded with walking up a flight of stairs and feels he is too young to be feeling like that.   Smoking status reviewed- 2 cigs/day  Review of Systems- see HPI   Objective:  BP 130/88   Pulse 77   Temp 99 F (37.2 C) (Oral)   Ht 5\' 9"  (1.753 m)   Wt 165 lb 9.6 oz (75.1 kg)   SpO2 98%   BMI 24.45 kg/m  Vitals and nursing note reviewed  General: well nourished, in no acute distress HEENT: normocephalic, MMM Cardiac: RRR, clear S1 and S2, no murmurs, rubs, or gallops Respiratory: clear to auscultation bilaterally, no increased work of breathing Extremities: no edema Neuro: alert and oriented, no focal deficits   Assessment & Plan:    Essential hypertension  Well controlled today at goal <140/90, continue losartan and HCTZ. Follow up with PCP 2 months to recheck.   Tobacco abuse  Patient has cut down to 2 cigarettes a day. Praised efforts. Information given on more tips to quit totally. Follow up 2 months to check in.   Exertional  chest pain  Stress test "excellent" per cardiology, no ischemia. Note given for patient to return to work on light duty 8 hour shifts. Follow up 2 months    Return in about 2 months (around 03/15/2018).   Dolores PattyAngela Elfida Shimada, DO Family Medicine Resident PGY-3

## 2018-01-17 ENCOUNTER — Telehealth: Payer: Self-pay

## 2018-01-17 NOTE — Telephone Encounter (Signed)
Need work note to be in more detail, what type of light duty, weight restrictions, bending etc. How many hours per day? Was sent home from work today.  Call patient at [phone] if you need more information.    Ples Specter, RN [facility] RN)

## 2018-01-18 NOTE — Telephone Encounter (Signed)
Pt calls to check status. Fleeger, Jessica Dawn, CMA  

## 2018-01-18 NOTE — Telephone Encounter (Signed)
Work note was given by Dr. Wonda Oldsiccio. Please forward to her for completion.

## 2018-01-19 NOTE — Telephone Encounter (Signed)
  I can write a more specific letter, when I saw him he just stated "light duty with 8 hour shifts" so that is what I wrote. I'll talk to him more Friday.

## 2018-01-19 NOTE — Telephone Encounter (Signed)
Pt calls to check status of letter.  He is unable to return to work without letter.  During conversation, pt decided to make an appt to make sure the letter is correct and to get another form completed.    Appt made for Friday (01/21/18) @ 2:50. Fleeger, Maryjo Rochester, CMA

## 2018-01-21 ENCOUNTER — Other Ambulatory Visit: Payer: Self-pay

## 2018-01-21 ENCOUNTER — Ambulatory Visit (INDEPENDENT_AMBULATORY_CARE_PROVIDER_SITE_OTHER): Payer: BLUE CROSS/BLUE SHIELD | Admitting: Family Medicine

## 2018-01-21 ENCOUNTER — Encounter: Payer: Self-pay | Admitting: Family Medicine

## 2018-01-21 VITALS — BP 138/92 | HR 76 | Temp 98.5°F | Wt 171.0 lb

## 2018-01-21 DIAGNOSIS — I10 Essential (primary) hypertension: Secondary | ICD-10-CM | POA: Diagnosis not present

## 2018-01-21 DIAGNOSIS — R079 Chest pain, unspecified: Secondary | ICD-10-CM | POA: Diagnosis not present

## 2018-01-21 NOTE — Patient Instructions (Signed)
  Nice to see you again today Please let us know how things go with these forms  Please return to follow up with your PCP in 2-3 months to check blood pressure.   Dolores Patty, DO PGY-3, [facility] 01/21/2018 2:59 PM

## 2018-01-21 NOTE — Progress Notes (Signed)
    Subjective:    Patient ID: Anthony Randolph, male    DOB: January 23, 1970, 48 y.o.   MRN: [id number]   CC: work note  HPI: patient needs work note revised for work and also has another form for me to fill out for the time he missed.  HTN- reports he is compliant with HCTZ and losartan. Denies CP, SOB.   Smoking status reviewed- current every day smoker  Review of Systems- see HPI   Objective:  BP (!) 138/92   Pulse 76   Temp 98.5 F (36.9 C) (Oral)   Wt 171 lb (77.6 kg)   SpO2 99%   BMI 25.25 kg/m  Vitals and nursing note reviewed  General: well nourished, in no acute distress HEENT: normocephalic, MMM Cardiac: regular rate Respiratory: no increased work of breathing Neuro: alert and oriented, no focal deficits   Assessment & Plan:    Essential hypertension  Chronic, minimally elevated today above goal of DBP 90, patient smoked cigarette prior to coming into office which explains elevation. Continue current regimen. Encouraged smoking cessation. Follow up w/ PCP 2 months to check in.  Exertional chest pain  Work form filled out for time missed working this issue up and to resume work on [employer] duty"    Return in about 2 months (around 03/22/2018), or if symptoms worsen or fail to improve.   Dolores Patty, DO Family Medicine Resident PGY-3

## 2018-01-24 NOTE — Assessment & Plan Note (Signed)
  Work form filled out for time missed working this issue up and to resume work on [employer] duty"

## 2018-01-24 NOTE — Assessment & Plan Note (Signed)
  Chronic, minimally elevated today above goal of DBP 90, patient smoked cigarette prior to coming into office which explains elevation. Continue current regimen. Encouraged smoking cessation. Follow up w/ PCP 2 months to check in.

## 2018-01-31 ENCOUNTER — Ambulatory Visit: Payer: BLUE CROSS/BLUE SHIELD | Admitting: Cardiovascular Disease

## 2018-02-17 ENCOUNTER — Ambulatory Visit (INDEPENDENT_AMBULATORY_CARE_PROVIDER_SITE_OTHER): Payer: BLUE CROSS/BLUE SHIELD | Admitting: Family Medicine

## 2018-02-17 ENCOUNTER — Ambulatory Visit: Payer: BLUE CROSS/BLUE SHIELD

## 2018-02-17 ENCOUNTER — Encounter: Payer: Self-pay | Admitting: Family Medicine

## 2018-02-17 ENCOUNTER — Other Ambulatory Visit: Payer: Self-pay

## 2018-02-17 VITALS — BP 172/110 | HR 65 | Temp 98.6°F | Ht 69.0 in | Wt 164.4 lb

## 2018-02-17 DIAGNOSIS — F1010 Alcohol abuse, uncomplicated: Secondary | ICD-10-CM | POA: Diagnosis not present

## 2018-02-17 DIAGNOSIS — I10 Essential (primary) hypertension: Secondary | ICD-10-CM | POA: Diagnosis not present

## 2018-02-17 DIAGNOSIS — F17200 Nicotine dependence, unspecified, uncomplicated: Secondary | ICD-10-CM | POA: Diagnosis not present

## 2018-02-17 DIAGNOSIS — F4321 Adjustment disorder with depressed mood: Secondary | ICD-10-CM | POA: Diagnosis not present

## 2018-02-17 LAB — POCT URINALYSIS DIP (MANUAL ENTRY)
Bilirubin, UA: NEGATIVE
Blood, UA: NEGATIVE
Glucose, UA: NEGATIVE mg/dL
Ketones, POC UA: NEGATIVE mg/dL
Leukocytes, UA: NEGATIVE
Nitrite, UA: NEGATIVE
Spec Grav, UA: 1.02 (ref 1.010–1.025)
Urobilinogen, UA: 0.2 E.U./dL
pH, UA: 6 (ref 5.0–8.0)

## 2018-02-17 MED ORDER — AMLODIPINE BESYLATE 5 MG PO TABS: 5.0000 mg | ORAL_TABLET | Freq: Every day | ORAL | 3 refills | Status: DC

## 2018-02-17 NOTE — Patient Instructions (Signed)
Thank you for coming in to see [place] today. Please see below to review our plan for today's visit.  1.  I am sorry to hear about the situations with your younger brother and your mother.  I did put a referral in for you to see our behavioral therapy.  They should reach out to you in the next couple of weeks to set up the appointment.  You can discuss medications with your primary care doctor at your next appointment.  Let us know if your symptoms get worse. 2.  Your blood pressure remains uncontrolled.  It is important that you cut back on your alcohol to no more than once per week and no more than 2 beers per day.  Avoiding smoking altogether will be most beneficial, however I know stressful situations will make this difficult. 3.  I called in a new medication called amlodipine.  Take this with your other blood pressure medications daily.  Please call the clinic at [phone] if your symptoms worsen or you have any concerns. It was our pleasure to serve you.  Durward Parcel, DO [facility], PGY-3

## 2018-02-17 NOTE — Assessment & Plan Note (Signed)
Chronic.  Cigarettes only.  Not interested in cessation today.  And increased stress, would not be a good candidate for cessation at this time. - Did discuss smoking cessation and advised patient to let us know when he is ready

## 2018-02-17 NOTE — Assessment & Plan Note (Addendum)
Does have symptoms concerning for depression.  PHQ 9 score 9, somewhat difficult.  Interested in behavioral therapy but would like to wait on pharmacotherapy at this time.  I think this is appropriate getting the mild severity of his symptoms.  Patient does not appear to be a threat to self or others.  He does have concomitant substance use disorder. - Ambulatory referral to behavioral therapy - See plan for alcohol use disorder - Follow-up with PCP at next available appointment to discuss pharmacotherapy

## 2018-02-17 NOTE — Progress Notes (Signed)
Subjective   Patient ID: Lorren Commander    DOB: 07-Oct-1970, 48 y.o. male   MRN: [id number]  CC: "Hypertension"  HPI: Merril Mccusker is a 48 y.o. male who presents to clinic today for the following:  Hypertension: Patient is here today for blood pressure check after being dismissed from work yesterday because he "felt empty" which he reports is typical when his blood pressure is high.  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  He reports good adherence with his antihypertensives and missed 1 dose in the last week but took his medications this morning.  He denies symptoms of chest pain, shortness of breath, nausea or vomiting, abdominal pain, lower extremity edema, change in vision, syncope.  He does report alcohol use and tobacco use regularly.  He has been under increased stress lately.  Alcohol use disorder: Patient reports increased stress in his life lately due to the death of his younger brother and needing to care for his mother who is not doing well.  He does report some feelings of depression and anhedonia.  He denies SI/HI.  He currently drinks 1-2 beers per day on average 3 days out of the week and does not thought about cutting back but would be willing.  Tobacco use disorder: Current everyday smoker with 1/2 pack/day for the last 24 years.  He has increased his use over the last couple of months due to the life events mentioned above.  Denies cough, change in weight, chest pain, or shortness of breath.  ROS: see HPI for pertinent.  PMFSH: Reviewed. Smoking status reviewed. Medications reviewed.  Objective   BP (!) 172/110   Pulse 65   Temp 98.6 F (37 C) (Oral)   Ht 5\' 9"  (1.753 m)   Wt 164 lb 6.4 oz (74.6 kg)   SpO2 98%   BMI 24.28 kg/m  Vitals and nursing note reviewed.  General: well nourished, well developed, NAD with non-toxic appearance HEENT: normocephalic, atraumatic, moist mucous membranes Neck: supple, non-tender without  lymphadenopathy Cardiovascular: regular rate and rhythm without murmurs, rubs, or gallops Lungs: clear to auscultation bilaterally with normal work of breathing Abdomen: soft, non-tender, non-distended, normoactive bowel sounds Skin: warm, dry, no rashes or lesions, cap refill < 2 seconds Extremities: warm and well perfused, normal tone, no edema  Assessment & Plan   Grief Does have symptoms concerning for depression.  PHQ 9 score 9, somewhat difficult.  Interested in behavioral therapy but would like to wait on pharmacotherapy at this time.  I think this is appropriate getting the mild severity of his symptoms.  Patient does not appear to be a threat to self or others.  He does have concomitant substance use disorder. - Ambulatory referral to behavioral therapy - See plan for alcohol use disorder - Follow-up with PCP at next available appointment to discuss pharmacotherapy  Primary hypertension Chronic.  Poorly controlled.  On thiazide and ARB.  History of CCB use, discontinued due to possible associated ED.  Uncertain if ED is actually related to amlodipine use versus poorly controlled blood pressure.  Benefits seem to be outweigh the risks.  Patient in agreement with plan for now.  There does seem to be some signs of worsening kidney function based on prior kidney function the last few years. - Continue Cozaar 100 mg daily and HCTZ 25 mg daily and will begin amlodipine 5 mg daily - Checking UA with micro, microalbumin and creatinine ratio, and BMET kidney function - RTC PCPs next available  appointment or sooner if needed  Tobacco abuse Chronic.  Cigarettes only.  Not interested in cessation today.  And increased stress, would not be a good candidate for cessation at this time. - Did discuss smoking cessation and advised patient to let us know when he is ready  Alcohol use disorder, mild, abuse Chronic.  Beer only.  Mild use.  No history of withdrawal.  No SI/HI.  No illicit drug use. -  Discussed cutting back to no more than 1 beer per day for no more than 1 day/week  Orders Placed This Encounter  Procedures  . Basic Metabolic Panel  . TSH  . Microalbumin/Creatinine Ratio, Urine  . Ambulatory referral to Behavioral Health    Referral Priority:   Routine    Referral Type:   Psychiatric    Referral Reason:   Specialty Services Required    Requested Specialty:   Behavioral Health    Number of Visits Requested:   1  . POCT urinalysis dipstick   Meds ordered this encounter  Medications  . amLODipine (NORVASC) 5 MG tablet    Sig: Take 1 tablet (5 mg total) by mouth daily.    Dispense:  90 tablet    Refill:  3    Durward Parcel, DO [facility], PGY-3 02/17/2018, 4:44 PM

## 2018-02-17 NOTE — Assessment & Plan Note (Signed)
Chronic.  Beer only.  Mild use.  No history of withdrawal.  No SI/HI.  No illicit drug use. - Discussed cutting back to no more than 1 beer per day for no more than 1 day/week

## 2018-02-17 NOTE — Assessment & Plan Note (Signed)
Chronic.  Poorly controlled.  On thiazide and ARB.  History of CCB use, discontinued due to possible associated ED.  Uncertain if ED is actually related to amlodipine use versus poorly controlled blood pressure.  Benefits seem to be outweigh the risks.  Patient in agreement with plan for now.  There does seem to be some signs of worsening kidney function based on prior kidney function the last few years. - Continue Cozaar 100 mg daily and HCTZ 25 mg daily and will begin amlodipine 5 mg daily - Checking UA with micro, microalbumin and creatinine ratio, and BMET kidney function - RTC PCPs next available appointment or sooner if needed

## 2018-02-18 ENCOUNTER — Telehealth: Payer: Self-pay | Admitting: Family Medicine

## 2018-02-18 LAB — BASIC METABOLIC PANEL
BUN/Creatinine Ratio: 9 (ref 9–20)
BUN: 12 mg/dL (ref 6–24)
CO2: 21 mmol/L (ref 20–29)
Calcium: 9.8 mg/dL (ref 8.7–10.2)
Chloride: 107 mmol/L — ABNORMAL HIGH (ref 96–106)
Creatinine, Ser: 1.38 mg/dL — ABNORMAL HIGH (ref 0.76–1.27)
GFR calc Af Amer: 70 mL/min/{1.73_m2} (ref >59)
GFR calc non Af Amer: 61 mL/min/{1.73_m2} (ref >59)
Glucose: 88 mg/dL (ref 65–99)
Potassium: 4.8 mmol/L (ref 3.5–5.2)
Sodium: 145 mmol/L — ABNORMAL HIGH (ref 134–144)

## 2018-02-18 LAB — MICROALBUMIN / CREATININE URINE RATIO
Creatinine, Urine: 211.2 mg/dL
Microalb/Creat Ratio: 27 mg/g creat (ref 0–29)
Microalbumin, Urine: 57.3 ug/mL

## 2018-02-18 LAB — TSH: TSH: 0.658 u[IU]/mL (ref 0.450–4.500)

## 2018-02-18 NOTE — Telephone Encounter (Signed)
FMLA form dropped off at front desk for completion.  Verified that patient section of form has been completed.  Last DOS/WCC with PCP was 02/17/2018.  Placed form in team folder to be completed by clinical staff.  Anthony Randolph

## 2018-02-18 NOTE — Telephone Encounter (Addendum)
Reviewed FMLA form and placed in PCP's box for completion.  Lodema Hong, Sonny Masters

## 2018-02-21 ENCOUNTER — Encounter: Payer: Self-pay | Admitting: Family Medicine

## 2018-02-25 NOTE — Telephone Encounter (Deleted)
FMLA Forms complete and placed in Nurse Forms/PA box. 

## 2018-02-25 NOTE — Telephone Encounter (Signed)
LMOVM of pt informing of the below:  Faxed to [facility] as requested @ [phone].  Original placed up front for pickup   Copy placed in batch scanning.   Lia Vigilante, Maryjo Rochester, CMA

## 2018-02-25 NOTE — Telephone Encounter (Signed)
FMLA Forms complete and placed in Nurse Forms/PA box.

## 2018-06-29 ENCOUNTER — Telehealth: Payer: Self-pay | Admitting: Family Medicine

## 2018-06-29 ENCOUNTER — Encounter: Payer: Self-pay | Admitting: Family Medicine

## 2018-06-29 ENCOUNTER — Ambulatory Visit (INDEPENDENT_AMBULATORY_CARE_PROVIDER_SITE_OTHER): Payer: BC Managed Care – PPO | Admitting: Family Medicine

## 2018-06-29 ENCOUNTER — Other Ambulatory Visit: Payer: Self-pay

## 2018-06-29 DIAGNOSIS — Z720 Tobacco use: Secondary | ICD-10-CM

## 2018-06-29 DIAGNOSIS — I10 Essential (primary) hypertension: Secondary | ICD-10-CM | POA: Diagnosis not present
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
## 2018-06-29 NOTE — Assessment & Plan Note (Signed)
Patient is interested in cutting back, would like to continue doing so on his own.  Recommended decreasing his back by 1 cigarette/day.  Let him know we can provide nicotine replacement therapy if he wishes to have additional help quitting in the future.

## 2018-06-29 NOTE — Patient Instructions (Signed)
Wonderful to meet you today! Please increase your Norvasc to 10mg  daily. Also contact your insurance to see if you can get a blood pressure cuff for free, start monitoring your blood pressure daily and keep a log.

## 2018-06-29 NOTE — Telephone Encounter (Signed)
FMLA form (and request for letter) dropped off for at front desk for completion.  Verified that patient section of form has been completed.  Last DOS/WCC was 02/17/18.  Placed form in team folder to be completed by clinical staff.  Crista Luria

## 2018-06-29 NOTE — Telephone Encounter (Signed)
Reviewed form and placed in PCP's box for completion.  .Michelle R Simpson, CMA  

## 2018-06-29 NOTE — Assessment & Plan Note (Addendum)
Poor control. BP 162/110 in the office today. Endorses compliance with current antihypertensive regimen. 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  Despite this, has not followed up since February. Question secondary gain of continuing uncontrolled hypertension allowing for him to be out of work more frequently. - Continue home losartan 100 mg, HCTZ 25 mg - Increase Norvasc from 5 mg to 10 mg daily - Patient believes his insurance provider will cover for a blood pressure cuff at home, he is to contact them and see if this is possible - If blood pressure cuff provided, start monitoring blood pressure daily and keep a journal to bring into next visit - Follow-up with primary care provider in 2 weeks, return precautions extensively discussed

## 2018-06-30 ENCOUNTER — Encounter: Payer: Self-pay | Admitting: Family Medicine

## 2018-06-30 NOTE — Telephone Encounter (Signed)
FMLA forms were completed in February 2020. Patient has no history of respiratory compromise, thus I will not write a letter to excuse him from wearing a face mask.

## 2018-06-30 NOTE — Telephone Encounter (Signed)
Called patient and left voice message to call [facility] to get message from Dr. Tarry Kos concerning request.  .Anthony Randolph, [place]

## 2018-07-01 NOTE — Telephone Encounter (Signed)
Forms faxed to number provider. Copy made for batch scanning.

## 2018-07-19 ENCOUNTER — Other Ambulatory Visit: Payer: Self-pay

## 2018-07-19 ENCOUNTER — Ambulatory Visit (INDEPENDENT_AMBULATORY_CARE_PROVIDER_SITE_OTHER): Payer: BC Managed Care – PPO | Admitting: Family Medicine

## 2018-07-19 ENCOUNTER — Encounter: Payer: Self-pay | Admitting: Family Medicine

## 2018-07-19 VITALS — BP 139/93 | HR 79 | Temp 98.3°F | Wt 160.0 lb

## 2018-07-19 DIAGNOSIS — I10 Essential (primary) hypertension: Secondary | ICD-10-CM | POA: Diagnosis not present

## 2018-07-19 MED ORDER — AMLODIPINE BESYLATE 10 MG PO TABS: 10.0000 mg | ORAL_TABLET | Freq: Every day | ORAL | 3 refills | Status: DC

## 2018-07-19 MED ORDER — LOSARTAN POTASSIUM 100 MG PO TABS: 100.0000 mg | ORAL_TABLET | Freq: Every day | ORAL | 3 refills | Status: DC

## 2018-07-19 MED ORDER — HYDROCHLOROTHIAZIDE 25 MG PO TABS: 25.0000 mg | ORAL_TABLET | Freq: Every day | ORAL | 3 refills | Status: DC

## 2018-07-19 NOTE — Assessment & Plan Note (Signed)
Improved. Will continue current regimen. Plan for follow up in 1 month to recheck blood pressure. If remains stable, can consider less frequent visits. BMP today to monitor kidney function. Patient understands and agrees to plan.

## 2018-07-19 NOTE — Patient Instructions (Signed)
Thank you so much for coming to see me today. You blood pressure continues to improve. Lets continue your current medications and follow up in 1 month for another blood pressure check. I have also gotten some blood work. I will contact you if anything is abnormal. Thank you again and please reach out for any new concerns.  Mina Marble, DO [facility]

## 2018-07-19 NOTE — Progress Notes (Signed)
   Subjective:   Patient ID: Antonieta Loveless    DOB: 25-Apr-1970, 48 y.o. male   MRN: [id number]  Erique Kaser is a 48 y.o. male here for follow up of blood pressure.  Blood Pressure: BP today 139/93. Notes he took he last blood pressrue medication yesterday due to running out of rx. He also notes his blood pressure is more controlled today because he didn't work today and thus less stressed, especially since he isn't having to wear his mask all the time. Denies any headaches or vision changes but does get occasionally short of breath when he gets hot with his mask on. He complains about the mask but does note several confirmed COVID positive coworkers thus it is strict policy for mask wearing. Denies any chest pain. Denies checking blood pressures at home.  Review of Systems:  Per HPI.   [place], medications and smoking status reviewed.  Objective:   BP (!) 139/93   Pulse 79   Temp 98.3 F (36.8 C) (Oral)   Wt 160 lb (72.6 kg)   SpO2 97%   BMI 23.63 kg/m  Vitals and nursing note reviewed.  General: well nourished, well developed, in no acute distress with non-toxic appearance, sitting comfortably in exam chair HEENT: normocephalic, atraumatic, CV: regular rate and rhythm without murmurs, rubs, or gallops, no lower extremity edema Lungs: clear to auscultation bilaterally with normal work of breathing Abdomen: soft, non-tender, non-distended, normoactive bowel sounds Skin: warm, dry Extremities: warm and well perfused Neuro:  speech normal  Assessment & Plan:   Primary hypertension Improved. Will continue current regimen. Plan for follow up in 1 month to recheck blood pressure. If remains stable, can consider less frequent visits. BMP today to monitor kidney function. Patient understands and agrees to plan.  Orders Placed This Encounter  Procedures  . Basic Metabolic Panel   Meds ordered this encounter  Medications  . hydrochlorothiazide (HYDRODIURIL) 25 MG tablet    Sig:  Take 1 tablet (25 mg total) by mouth daily.    Dispense:  90 tablet    Refill:  3  . amLODipine (NORVASC) 10 MG tablet    Sig: Take 1 tablet (10 mg total) by mouth at bedtime.    Dispense:  90 tablet    Refill:  3  . losartan (COZAAR) 100 MG tablet    Sig: Take 1 tablet (100 mg total) by mouth daily.    Dispense:  90 tablet    Refill:  [place], DO PGY-2, [facility] 07/19/2018 11:01 PM

## 2018-07-20 LAB — BASIC METABOLIC PANEL
BUN/Creatinine Ratio: 6 — ABNORMAL LOW (ref 9–20)
BUN: 9 mg/dL (ref 6–24)
CO2: 24 mmol/L (ref 20–29)
Calcium: 9.8 mg/dL (ref 8.7–10.2)
Chloride: 104 mmol/L (ref 96–106)
Creatinine, Ser: 1.46 mg/dL — ABNORMAL HIGH (ref 0.76–1.27)
GFR calc Af Amer: 65 mL/min/{1.73_m2} (ref >59)
GFR calc non Af Amer: 56 mL/min/{1.73_m2} — ABNORMAL LOW (ref >59)
Glucose: 86 mg/dL (ref 65–99)
Potassium: 4.5 mmol/L (ref 3.5–5.2)
Sodium: 141 mmol/L (ref 134–144)

## 2018-08-05 ENCOUNTER — Ambulatory Visit (INDEPENDENT_AMBULATORY_CARE_PROVIDER_SITE_OTHER): Payer: BC Managed Care – PPO | Admitting: Family Medicine

## 2018-08-05 ENCOUNTER — Other Ambulatory Visit: Payer: Self-pay

## 2018-08-05 ENCOUNTER — Encounter: Payer: Self-pay | Admitting: Family Medicine

## 2018-08-05 VITALS — BP 140/92 | HR 74 | Temp 97.8°F | Wt 157.0 lb

## 2018-08-05 DIAGNOSIS — Z720 Tobacco use: Secondary | ICD-10-CM | POA: Diagnosis not present

## 2018-08-05 DIAGNOSIS — I10 Essential (primary) hypertension: Secondary | ICD-10-CM | POA: Diagnosis not present

## 2018-08-05 DIAGNOSIS — N182 Chronic kidney disease, stage 2 (mild): Secondary | ICD-10-CM

## 2018-08-05 NOTE — Patient Instructions (Signed)
For your blood pressure, please buy a blood pressure cuff at your local pharmacy and check your blood pressure 1-2 times a day while at home in a nonstressful setting. Please write these numbers down for the next two weeks to keep a blood pressure diary.   I am so proud of you for stopping smoking! Please continue to do this, this is not only great for your blood pressure, your heart, but also your kidneys.  I have also send in a referral for a sleep study. Please expect a call in 1-2 weeks to have this scheduled.  Please try to avoid medicines like ibuprofen or naproxen because these are dangerous for your kidneys. You can use Tylenol for pain if you need to.   I would like you to follow up with me in 2 weeks to evaluate your blood pressure and kidneys.   If you need anything else, please call the clinic.   Take care, Dr. Tarry Kos and Mel Almond

## 2018-08-05 NOTE — Progress Notes (Signed)
Subjective:   Patient ID: Anthony Randolph    DOB: [birth year], 48 y.o. male   MRN: [id number]  Rasheem Figiel is a 48 y.o. male with a history of HTN here for blood pressure follow up.  Hypertension: Patient returns today for blood pressure follow up. His blood pressure today is 140/92. Repeat BP 142/92. He currently is on Amlodipine 10mg , HCTZ 25mg , and Losartan 100mg . He endorses compliance. He denies any chest pain, SOB, headaches, or vision changes. He notes that his BP is often elevated at work and can range from 128-158/96-115. This concerns him. He endorses feeling fatigued throughout the day and not feeling like he gets good sleep. He notes that he does snore very loudly, however he lives alone so he is unsure if he stops breathing during his sleep.   Tobacco use: Patient notes quitting smoking two weeks ago. He notes feeling very confident in continuing to do this. He understands the importance this is for his health and if he wants to live longer.   Review of Systems:  Per HPI.   [place], medications and smoking status reviewed.  Objective:   BP (!) 140/92    Pulse 74    Temp 97.8 F (36.6 C) (Oral)    Wt 157 lb (71.2 kg)    SpO2 98%    BMI 23.18 kg/m  Vitals and nursing note reviewed.  General: well nourished, well developed, in no acute distress with non-toxic appearance, sitting comfortably in exam chair CV: regular rate and rhythm without murmurs, rubs, or gallops, no lower extremity edema Lungs: clear to auscultation bilaterally with normal work of breathing Skin: warm, dry Extremities: warm and well perfused Neuro: speech normal  Assessment & Plan:   Primary hypertension Blood pressure today is about at goal (<140/90). Additionally, his reported elevated blood pressures are noted to be at work which is very stressful for him. Unsure what his true blood pressures are. He is currently maxxed out on three blood pressure medications. His report of snoring and daytime  sleepiness raises concern for sleep apnea which could be contributing to his uncontrolled HTN. Other forms of secondary HTN are possible , however normal electrolytes and Microalbumin:Cr ratio is reassuring.  I would like to get a clearer picture of his blood pressures at home before further exploring. Patient does have stable CKD stage 2 which is likely complicating his treatment - patient to purchase home blood pressure cuff - blood pressure diary to record BP's at home for 2 weeks - ambulatory referral for sleep study - RTC in 2 weeks for blood pressure follow up  - consider further evaluation for secondary HTN if continues to remain elevated with BP diary - consider addition of spironolactone as it shown to be kidney protective  Tobacco abuse 2 weeks since last cigarette.  Congratulated patient on his success. Will continue to encourage cessation.   Stage 2 chronic kidney disease Last BMET indicative of elevated creatinine of 1.46 which appears to be at patient's new baseline of 1.3-1.6. Trace protein in last urinalysis, however Microalbumin:Cr ratio <30. Patient already on ARB which is kidney protective. Can consider addition of spironolactone as well. May need to discontinue HCTZ if continues to worsen.  - obtain repeat BMET at 2 week follow up to evaluate kidney function  Orders Placed This Encounter  Procedures   Ambulatory referral to Sleep Studies    Referral Priority:   Routine    Referral Type:   Consultation    Referral Reason:  Specialty Services Required    Number of Visits Requested:   1   No orders of the defined types were placed in this encounter.  Plan discussed with attending Dr. Lum BabeEniola.    Orpah CobbKiersten Edwen Mclester, DO PGY-2, [facility] 08/07/2018 10:59 AM

## 2018-08-07 DIAGNOSIS — N182 Chronic kidney disease, stage 2 (mild): Secondary | ICD-10-CM | POA: Insufficient documentation

## 2018-08-07 NOTE — Assessment & Plan Note (Signed)
2 weeks since last cigarette.  Congratulated patient on his success. Will continue to encourage cessation.

## 2018-08-07 NOTE — Assessment & Plan Note (Signed)
Last BMET notable for elevated creatinine of 1.46, which appears to be at his new baseline of 1.3-1.6. Last normal creatinine level was in 2018 at 1.2. GFR is

## 2018-08-07 NOTE — Assessment & Plan Note (Signed)
Last BMET indicative of elevated creatinine of 1.46 which appears to be at patient's new baseline of 1.3-1.6. Trace protein in last urinalysis, however Microalbumin:Cr ratio <30. Patient already on ARB which is kidney protective. Can consider addition of spironolactone as well. May need to discontinue HCTZ if continues to worsen.  - obtain repeat BMET at 2 week follow up to evaluate kidney function

## 2018-08-07 NOTE — Assessment & Plan Note (Addendum)
Blood pressure today is about at goal (<140/90). Additionally, his reported elevated blood pressures are noted to be at work which is very stressful for him. Unsure what his true blood pressures are. He is currently maxxed out on three blood pressure medications. His report of snoring and daytime sleepiness raises concern for sleep apnea which could be contributing to his uncontrolled HTN. Other forms of secondary HTN are possible , however normal electrolytes and Microalbumin:Cr ratio is reassuring.  I would like to get a clearer picture of his blood pressures at home before further exploring. Patient does have stable CKD stage 3 which is likely complicating his treatment - patient to purchase home blood pressure cuff - blood pressure diary to record BP's at home for 2 weeks - ambulatory referral for sleep study - RTC in 2 weeks for blood pressure follow up  - consider further evaluation for secondary HTN if continues to remain elevated with BP diary - consider addition of spironolactone as it shown to be kidney protective

## 2018-08-17 ENCOUNTER — Ambulatory Visit (INDEPENDENT_AMBULATORY_CARE_PROVIDER_SITE_OTHER): Payer: BC Managed Care – PPO | Admitting: Family Medicine

## 2018-08-17 ENCOUNTER — Other Ambulatory Visit: Payer: Self-pay

## 2018-08-17 ENCOUNTER — Encounter: Payer: Self-pay | Admitting: Family Medicine

## 2018-08-17 VITALS — BP 122/70 | HR 88

## 2018-08-17 DIAGNOSIS — Z720 Tobacco use: Secondary | ICD-10-CM | POA: Diagnosis not present

## 2018-08-17 DIAGNOSIS — M25551 Pain in right hip: Secondary | ICD-10-CM

## 2018-08-17 DIAGNOSIS — Z114 Encounter for screening for human immunodeficiency virus [HIV]: Secondary | ICD-10-CM

## 2018-08-17 DIAGNOSIS — I10 Essential (primary) hypertension: Secondary | ICD-10-CM

## 2018-08-17 DIAGNOSIS — S76011A Strain of muscle, fascia and tendon of right hip, initial encounter: Secondary | ICD-10-CM

## 2018-08-17 MED ORDER — DICLOFENAC SODIUM 1 % TD GEL: 2.0000 g | Freq: Four times a day (QID) | TRANSDERMAL | 2 refills | Status: DC

## 2018-08-17 MED ORDER — CYCLOBENZAPRINE HCL 10 MG PO TABS: 10.0000 mg | ORAL_TABLET | Freq: Three times a day (TID) | ORAL | 0 refills | Status: DC | PRN

## 2018-08-17 NOTE — Progress Notes (Signed)
Subjective:   Patient ID: Anthony Randolph    DOB: 01-24-1970, 48 y.o. male   MRN: [id number]  Anthony Randolph is a 48 y.o. male with a history of HTN here for BP follow up and back pain.  HTN: Patient here for BP follow up. BP today 122/70. He is currently taking Amlodipine 10mg  QD, HCTZ 25mg  QD, and Losartan 10mg  QD. He has been keeping a BP diary x 1 week BID, he notes 125-158 / 95-115. Denies any chest pain, SOB, headaches, vision changes. He notes he has not received any phone calls about the sleep study.   Right Back/hip pain: Right sided low back pain and hip pain x 6 days, but got much worse yesterday after lifting a heavy box. He was on light duty but was recently taken off light duty. He notes he cant even sit down due to the pain. He notes the pain is in his right groin and lateral butt with occasional shooting pain down to his posterior knee. He has not been treating the pain with anything.  Declines any knee pain. Denies any bowel or bladder dysfunction.   Tobacco use: Patient quit smoking for 1 month now. He notes good success and feels confident he can continue this.   Review of Systems:  Per HPI.   PMFSH, medications and smoking status reviewed.  Objective:   BP 122/70   Pulse 88   SpO2 98%  Vitals and nursing note reviewed.  General: well nourished, well developed, in no acute distress with non-toxic appearance, uncomfortable appearing in exam chair CV: regular rate and rhythm without murmurs, rubs, or gallops Lungs: clear to auscultation bilaterally with normal work of breathing Skin: warm, dry Extremities: warm and well perfused Neuro: Alert and oriented, speech normal  Lumbar spine: - Inspection: no gross deformity or asymmetry, swelling or ecchymosis - Palpation: No TTP over the spinous processes, paraspinal muscles, or SI joints b/l - ROM: Decreased ROM in all fields due to pain - Strength: 5/5 strength of lower extremity in L4-S1 nerve root distributions  b/l; Gait affected by pain - Special testing: Positive straight leg raise. Negative slump, Positive Stork test, Pain along groin and lateral aspect of hip with FABER  Hip:  - Inspection: No gross deformity, no swelling, erythema, or ecchymosis - Palpation: Tender to palpation along upper lateral buttocks and groin. No pain over greater trochanter. - ROM: Normal range of motion on Flexion, extension, abduction, internal and external rotation but lots of pain on right - Strength: Normal strength. - Neuro/vasc: NV intact distally - Special Tests: Pain in right groin with FABER and FADIR.  Positive Trendelenberg.    Assessment & Plan:   Primary hypertension Blood pressure normotensive today. BMP shows slightly improved kidney function (Cr 1.46>1.35) - continue Amlodipine 10mg , HCTZ 25mg  and Losartan 10mg  - RTC 4-6 weeks  Acute right hip pain Pain appears to be multifactorial including right gluteal muscle strain and inflammation of right hip joint. Do not suspect fracture at this time, however if no improvement will consider further imaging to evaluate  - light duty at work - work note provided - rest with activity as tolerated - referral for physical therapy - rx for Flexeril 10mg  TID PRN. Instruction for use provided - Tylenol and OTC voltaren gel for pain as needed - Icey-hot with lidocaine, ice or heat as needed - patient instructed to avoid NSAIDs due to resistant HTN - RTC in 4-6 weeks, sooner if no improvement or worsening - plan for  further imaging if no improvement at follow up visit  Tobacco abuse 1 month tobacco free! Congratulated patient on success. Educated patient on benefits to health with this decision.    Health Maintenance: Due for HIV screen and TDAP. Unable to provide TDAP vaccine at this time. Will offer at follow up visit - HIV screen obtained and negative - Flu vaccine when available for the year  Orders Placed This Encounter  Procedures  . Basic Metabolic  Panel  . HIV antibody (with reflex)  . Ambulatory referral to Physical Therapy    Referral Priority:   Routine    Referral Type:   Physical Medicine    Referral Reason:   Specialty Services Required    Requested Specialty:   Physical Therapy    Number of Visits Requested:   1   Meds ordered this encounter  Medications  . cyclobenzaprine (FLEXERIL) 10 MG tablet    Sig: Take 1 tablet (10 mg total) by mouth 3 (three) times daily as needed for muscle spasms.    Dispense:  30 tablet    Refill:  0  . diclofenac sodium (VOLTAREN) 1 % GEL    Sig: Apply 2 g topically 4 (four) times daily.    Dispense:  100 g    Refill:  [place], DO PGY-2, [facility] 08/18/2018 8:35 PM

## 2018-08-17 NOTE — Patient Instructions (Addendum)
Thank you for coming in to see me today. You blood pressure is doing much better. Please continue your medications as prescribed. I also got blood work today to check your kidney's. I will call you if there needs to be any changes.  In regards to your hip pain, I think you may have a gluteus muscle strain with some hip inflammation. I recommend over the counter Tylenol and Voltaren gel for pain. I have also called in a muscle relaxer called Flexeril. Please take this at night as it can make you sleepy and avoid alcohol while taking it. You can also try Icey-hot patches with lidocaine. Also try to ice the area 2-3 times a day. Please refrain from heavy lifting until improved. Please see me back again in 4-6 weeks.  Take care, Dr. Tarry Kos

## 2018-08-18 DIAGNOSIS — S76011A Strain of muscle, fascia and tendon of right hip, initial encounter: Secondary | ICD-10-CM | POA: Insufficient documentation

## 2018-08-18 DIAGNOSIS — M25551 Pain in right hip: Secondary | ICD-10-CM | POA: Insufficient documentation

## 2018-08-18 LAB — BASIC METABOLIC PANEL
BUN/Creatinine Ratio: 7 — ABNORMAL LOW (ref 9–20)
BUN: 10 mg/dL (ref 6–24)
CO2: 25 mmol/L (ref 20–29)
Calcium: 9.5 mg/dL (ref 8.7–10.2)
Chloride: 101 mmol/L (ref 96–106)
Creatinine, Ser: 1.35 mg/dL — ABNORMAL HIGH (ref 0.76–1.27)
GFR calc Af Amer: 72 mL/min/{1.73_m2} (ref >59)
GFR calc non Af Amer: 62 mL/min/{1.73_m2} (ref >59)
Glucose: 108 mg/dL — ABNORMAL HIGH (ref 65–99)
Potassium: 4 mmol/L (ref 3.5–5.2)
Sodium: 141 mmol/L (ref 134–144)

## 2018-08-18 LAB — HIV ANTIBODY (ROUTINE TESTING W REFLEX): HIV Screen 4th Generation wRfx: NONREACTIVE

## 2018-08-18 NOTE — Assessment & Plan Note (Addendum)
Pain appears to be multifactorial including right gluteal muscle strain and inflammation of right hip joint. Do not suspect fracture at this time, however if no improvement will consider further imaging to evaluate  - light duty at work - work note provided - rest with activity as tolerated - referral for physical therapy - rx for Flexeril 10mg  TID PRN. Instruction for use provided - Tylenol and OTC voltaren gel for pain as needed - Icey-hot with lidocaine, ice or heat as needed - patient instructed to avoid NSAIDs due to resistant HTN - RTC in 4-6 weeks, sooner if no improvement or worsening - plan for further imaging if no improvement at follow up visit

## 2018-08-18 NOTE — Assessment & Plan Note (Signed)
1 month tobacco free! Congratulated patient on success. Educated patient on benefits to health with this decision.

## 2018-08-18 NOTE — Assessment & Plan Note (Addendum)
Blood pressure normotensive today. BMP shows slightly improved kidney function (Cr 1.46>1.35) - continue Amlodipine 10mg , HCTZ 25mg  and Losartan 10mg  - RTC 4-6 weeks

## 2018-09-08 ENCOUNTER — Other Ambulatory Visit: Payer: Self-pay

## 2018-09-08 ENCOUNTER — Encounter: Payer: Self-pay | Admitting: Physical Therapy

## 2018-09-08 ENCOUNTER — Ambulatory Visit: Payer: BC Managed Care – PPO | Attending: Family Medicine | Admitting: Physical Therapy

## 2018-09-08 DIAGNOSIS — M25551 Pain in right hip: Secondary | ICD-10-CM | POA: Insufficient documentation

## 2018-09-08 DIAGNOSIS — M5441 Lumbago with sciatica, right side: Secondary | ICD-10-CM | POA: Insufficient documentation

## 2018-09-08 NOTE — Patient Instructions (Addendum)
TENS UNIT: This is helpful for muscle pain and spasm.   Search and Purchase a TENS 7000 2nd edition at www.[email or web address]. It should be less than $30.     TENS unit instructions: Do not shower or bathe with the unit on Turn the unit off before removing electrodes or batteries If the electrodes lose stickiness add a drop of water to the electrodes after they are disconnected from the unit and place on plastic sheet. If you continued to have difficulty, call the TENS unit company to purchase more electrodes. Do not apply lotion on the skin area prior to use. Make sure the skin is clean and dry as this will help prolong the life of the electrodes. After use, always check skin for unusual red areas, rash or other skin difficulties. If there are any skin problems, does not apply electrodes to the same area. Never remove the electrodes from the unit by pulling the wires. Do not use the TENS unit or electrodes other than as directed. Do not change electrode placement without consultating your therapist or physician. Keep 2 fingers with between each electrode. Wear time ratio is 2:1, on to off times.    For example on for 30 minutes off for 15 minutes and then on for 30 minutes off for 15 minutes     Access Code: [email or web address]  URL: [email or web address]  Date: 09/08/2018  Prepared by: Elsie Ra   Exercises  Supine Piriformis Stretch - 3 reps - 1 sets - 30 hold - 2x daily - 6x weekly  Supine Hamstring Stretch with Strap - 3 reps - 1 sets - 30 hold - 2x daily - 6x weekly  Supine Lower Trunk Rotation - 2-3 reps - 1 sets - 10 hold - 2x daily - 6x weekly  Supine Bridge - 10 reps - 1-2 sets - 5 hold - 2x daily - 6x weekly  Clamshell - 10 reps - 2 sets - 2x daily - 6x weekly

## 2018-09-08 NOTE — Therapy (Signed)
[facility] Outpatient Rehabilitation [facility] [address] [place], [place], [address] Phone: [phone]   Fax:  [phone]  Physical Therapy Evaluation  Patient Details  Name: Anthony Randolph MRN: [id number] Date of Birth: 04/02/1970 Referring Provider (PT): Westley Chandler, MD   Encounter Date: 09/08/2018  PT End of Session - 09/08/18 0950    Visit Number  1    Number of Visits  12    Date for PT Re-Evaluation  10/20/18    Authorization Type  BCBS    PT Start Time  0830    PT Stop Time  0920    PT Time Calculation (min)  50 min    Activity Tolerance  Patient limited by pain       Past Medical History:  Diagnosis Date  . Hypertension     History reviewed. No pertinent surgical history.  There were no vitals filed for this visit.   Subjective Assessment - 09/08/18 0838    Subjective  he relays he picked up something heavy at work 2 months ago and injured his back and Rt hip. He saw MD for this and referred to PT. He says the pain starts in the mid lower back and goes all the way down his Rt leg. he does report intermittent N/T down his Rt leg but none at the moment. He has pain and difficulty with any activity "if the pain hits", Pain is worse with prolonged standing and lifting. He has not found anything to relieve the pain.    Pertinent History  PMH: HTN, tobacco abuse,    How long can you sit comfortably?  varies    How long can you stand comfortably?  varies    How long can you walk comfortably?  2 hours    Diagnostic tests  no imaging    Patient Stated Goals  Get rid of the pain so I can walk and work without pain    Currently in Pain?  Yes    Pain Score  10-Worst pain ever   relays pain is a 12 but not so severe he has to go to hospital   Pain Location  Back   and Rt leg   Pain Orientation  Right    Pain Descriptors / Indicators  Aching;Numbness    Pain Type  Acute pain    Pain Radiating Towards  down Rt leg    Effect of Pain on Daily  Activities  limits his work, he is currently still working through the pain         [facility] PT Assessment - 09/08/18 0001      Assessment   Medical Diagnosis  Rt hip pain and Rt sided LBP    Referring Provider (PT)  Westley Chandler, MD    Onset Date/Surgical Date  --   2 month onset of pain   Next MD Visit  nothing scheduled    Prior Therapy  none      Precautions   Precautions  None      Restrictions   Weight Bearing Restrictions  No      Balance Screen   Has the patient fallen in the past 6 months  No      Home Environment   Living Environment  Private residence    Additional Comments  bedroom upstairs and they bother him so has been sleeping on the couch      Prior Function   Level of Independence  Independent    Vocation  Requirements  works Chief Executive Officerfilling gas lines, has to stand, walk, lift alot      Cognition   Overall Cognitive Status  Within Functional Limits for tasks assessed      Observation/Other Assessments   Focus on Therapeutic Outcomes (FOTO)   69% limited      Sensation   Light Touch  Appears Intact      ROM / Strength   AROM / PROM / Strength  AROM;Strength      AROM   Overall AROM Comments  pain with all directions, hip flexion limited to around 90 deg due to pain    AROM Assessment Site  Lumbar    Lumbar Flexion  75%    Lumbar Extension  25%    Lumbar - Right Side Bend  50%    Lumbar - Left Side Bend  50%    Lumbar - Right Rotation  50%    Lumbar - Left Rotation  50%      Strength   Overall Strength Comments  bilat Leg strength overall 5/5 except Rt hip flexion and abd 3/5 due to pain more than weakness      Flexibility   Soft Tissue Assessment /Muscle Length  --   tight H.S. bilat, tight lumbar P.S and hip rotators     Palpation   Spinal mobility  unable to test mobility with PAM testing to pain with light pressure applied    Palpation comment  TTP in paraspinals and spine from sacrum all the way up to upper thoracic      Special Tests    Other special tests  + slump test and SLR on Rt side, neg on Left, + FABERS and FADIR on Rt, Pain with LAD      Transfers   Transfers  Independent with all Transfers      Ambulation/Gait   Gait Comments  antalgic gait with decreased stance time on Rt side                Objective measurements completed on examination: See above findings.      [facility] Adult PT Treatment/Exercise - 09/08/18 0001      Modalities   Modalities  Moist Heat;Electrical Stimulation      Moist Heat Therapy   Number Minutes Moist Heat  15 Minutes    Moist Heat Location  Lumbar Spine      Electrical Stimulation   Electrical Stimulation Location  lumbar    Electrical Stimulation Action  IFC    Electrical Stimulation Parameters  tolreance    Electrical Stimulation Goals  Pain             PT Education - 09/08/18 0949    Education Details  HEP, POC, TENS, exam findings    Person(s) Educated  Patient    Methods  Explanation;Demonstration;Verbal cues;Handout    Comprehension  Verbalized understanding;Need further instruction          PT Long Term Goals - 09/08/18 1002      PT LONG TERM GOAL #1   Title  Pt will be I and compliant with HEP. (Target goal for all goals 6 weeks 10/20/18)    Status  New      PT LONG TERM GOAL #2   Title  Pt will improve lumbar ROM to [facility] to improve mobility    Status  New      PT LONG TERM GOAL #3   Title  He will report no radiculopathy down his Rt leg.  Status  New      PT LONG TERM GOAL #4   Title  He will have overall less than 3/10 pain with ususal work and activity.    Status  New             Plan - 09/08/18 6387    Clinical Impression Statement  Pt presents with signs and symptoms consistent of Rt sided lumbar/hip pain with radiculopathy. It is unclear if this is due to lumbar disc pathology, hip pathology, or muscle spasms as all special testing was positive for these. He had no change with direction specific exercises and even long  axis distraction caused pain. He has overall decreased hip and lumbar ROM, decreased Rt hip and core strength, decreased activity tolerance particularly with standing or walking, and increased pain limiting  functional and work abilities. He will benefit from skilled PT to address his deficits. PT trialed heat and TENS which did appear to give him some relief.    Personal Factors and Comorbidities  Comorbidity 1;Comorbidity 2    Comorbidities  PMH: HTN, tobacco abuse,    Examination-Activity Limitations  Bend;Carry;Lift;Squat;Stairs;Stand    Examination-Participation Restrictions  Cleaning;Community Activity;Driving;Laundry;Yard Work    Merchant navy officer  Evolving/Moderate complexity    Clinical Decision Making  Moderate    Rehab Potential  Good    PT Frequency  2x / week    PT Duration  6 weeks    PT Treatment/Interventions  Cryotherapy;Electrical Stimulation;Iontophoresis 4mg /ml Dexamethasone;Moist Heat;Traction;Ultrasound;Gait training;Therapeutic activities;Therapeutic exercise;Neuromuscular re-education;Manual techniques;Passive range of motion;Dry needling;Spinal Manipulations;Joint Manipulations;Taping    PT Next Visit Plan  how was TENS and HEP, needs pain science and gentle stretching and core to start, had poor tolerance and everything hurt on evaluaiton    PT Home Exercise Plan  piriformis stetch, HSS, LTR, sidelying clams, brige    Recommended Other Services  may need imaging if he does not improve with PT    Consulted and Agree with Plan of Care  Patient       Patient will benefit from skilled therapeutic intervention in order to improve the following deficits and impairments:  Abnormal gait, Decreased activity tolerance, Decreased strength, Difficulty walking, Postural dysfunction, Pain, Increased fascial restricitons, Increased muscle spasms, Decreased range of motion  Visit Diagnosis: Acute right-sided low back pain with right-sided sciatica  Pain in right  hip     Problem List Patient Active Problem List   Diagnosis Date Noted  . Muscle strain of right gluteal region 08/18/2018  . Acute right hip pain 08/18/2018  . Stage 2 chronic kidney disease 08/07/2018  . Primary hypertension 02/17/2018  . Tobacco abuse 10/15/2016    Silvestre Mesi 09/08/2018, 11:02 AM  [facility] [address] [place], [place], [address] Phone: [phone]   Fax:  [phone]  Name: Zebadiah Willert MRN: [id number] Date of Birth: [birth year]

## 2018-09-22 ENCOUNTER — Ambulatory Visit: Payer: BC Managed Care – PPO | Admitting: Physical Therapy

## 2018-09-27 ENCOUNTER — Encounter: Payer: Self-pay | Admitting: Physical Therapy

## 2018-09-27 ENCOUNTER — Other Ambulatory Visit: Payer: Self-pay

## 2018-09-27 ENCOUNTER — Ambulatory Visit: Payer: BC Managed Care – PPO | Admitting: Physical Therapy

## 2018-09-27 DIAGNOSIS — M5441 Lumbago with sciatica, right side: Secondary | ICD-10-CM

## 2018-09-27 DIAGNOSIS — M25551 Pain in right hip: Secondary | ICD-10-CM

## 2018-09-27 NOTE — Therapy (Addendum)
[facility] [place], [place], [address] Phone: [phone]   Fax:  [phone]  Physical Therapy Treatment/Discharge  Patient Details  Name: Anthony Randolph MRN: [id number] Date of Birth: 02/05/1970 Referring Provider (PT): Martyn Malay, MD   Encounter Date: 09/27/2018  PT End of Session - 09/27/18 1631    Visit Number  2    Number of Visits  12    Date for PT Re-Evaluation  10/20/18    Authorization Type  BCBS    PT Start Time  1623    PT Stop Time  1706    PT Time Calculation (min)  43 min    Activity Tolerance  Patient limited by pain    Behavior During Therapy  [facility] for tasks assessed/performed       Past Medical History:  Diagnosis Date  . Hypertension     History reviewed. No pertinent surgical history.  There were no vitals filed for this visit.  Subjective Assessment - 09/27/18 1625    Subjective  N/T in Rt leg was bad Sunday , had to work Saturday. Today, pain is 2/10.  Is careful with activity, lifting (< 5 lbs)    Currently in Pain?  Yes    Pain Score  2     Pain Location  Back    Pain Orientation  Right    Pain Descriptors / Indicators  Sore;Tightness    Pain Type  Acute pain    Pain Radiating Towards  Rt prox hip    Pain Onset  More than a month ago    Pain Frequency  Constant    Aggravating Factors   standing, walking    Pain Relieving Factors  distraction, medicine         [facility] Adult PT Treatment/Exercise - 09/27/18 0001      Lumbar Exercises: Stretches   Active Hamstring Stretch  3 reps;30 seconds    Single Knee to Chest Stretch  2 reps;30 seconds    Lower Trunk Rotation  5 reps;20 seconds    Lower Trunk Rotation Limitations  each side       Lumbar Exercises: Supine   Ab Set  10 reps    Pelvic Tilt  10 reps    Pelvic Tilt Limitations  mod to max cues     Bridge  10 reps    Bridge Limitations  articulating        Lumbar Exercises: Quadruped   Other Quadruped Lumbar  Exercises  childs pose x 3       Moist Heat Therapy   Number Minutes Moist Heat  15 Minutes    Moist Heat Location  Lumbar Spine      Electrical Stimulation   Electrical Stimulation Location  lumbar    Electrical Stimulation Action  IFC     Electrical Stimulation Parameters  9     Electrical Stimulation Goals  Pain                  PT Long Term Goals - 09/27/18 1631      PT LONG TERM GOAL #1   Title  Pt will be I and compliant with HEP. (Target goal for all goals 6 weeks 10/20/18)    Status  On-going      PT LONG TERM GOAL #2   Title  Pt will improve lumbar ROM to [facility] to improve mobility    Status  On-going      PT LONG TERM GOAL #  3   Title  He will report no radiculopathy down his Rt leg.    Status  On-going      PT LONG TERM GOAL #4   Title  He will have overall less than 3/10 pain with ususal work and activity.    Status  On-going            Plan - 09/27/18 1807    Clinical Impression Statement  Pt did not do any HEP.  He does have less pain however.  Reviewed and reissued HEP and encouraged him to do daily.  Discussed body mechanics and lifting a bit but needs practice with squats due to tight hips. .    PT Treatment/Interventions  Cryotherapy;Electrical Stimulation;Iontophoresis 88m/ml Dexamethasone;Moist Heat;Traction;Ultrasound;Gait training;Therapeutic activities;Therapeutic exercise;Neuromuscular re-education;Manual techniques;Passive range of motion;Dry needling;Spinal Manipulations;Joint Manipulations;Taping    PT Next Visit Plan  body mechanics, HEP, modlaites as needed    PT Home Exercise Plan  piriformis stetch, HSS, LTR, sidelying clams, brige, post pelvic tilt    Consulted and Agree with Plan of Care  Patient       Patient will benefit from skilled therapeutic intervention in order to improve the following deficits and impairments:  Abnormal gait, Decreased activity tolerance, Decreased strength, Difficulty walking, Postural dysfunction,  Pain, Increased fascial restricitons, Increased muscle spasms, Decreased range of motion  Visit Diagnosis: Acute right-sided low back pain with right-sided sciatica  Pain in right hip     Problem List Patient Active Problem List   Diagnosis Date Noted  . Muscle strain of right gluteal region 08/18/2018  . Acute right hip pain 08/18/2018  . Stage 2 chronic kidney disease 08/07/2018  . Primary hypertension 02/17/2018  . Tobacco abuse 10/15/2016    , 09/27/2018, 6:13 PM  [facility]GPon[place] [address]Phone: [phone]  Fax:  [phone] Name: Anthony DansbyMRN: [id number] of Birth: [birth year] JRaeford Razor PT 09/27/18 6:14 PM Phone: [phone]: [phone] PHYSICAL THERAPY DISCHARGE SUMMARY  Visits from Start of Care: 2  Current functional level related to goals / functional outcomes: Unknown   Remaining deficits: Unknown   Education / Equipment: HEP   Plan: Patient agrees to discharge.  Patient goals were not met. Patient is being discharged due to not returning since the last visit.  ?????    JRaeford Razor PT 10/24/18 2:27 PM Phone: [phone]: [phone]

## 2018-09-29 ENCOUNTER — Ambulatory Visit: Payer: BC Managed Care – PPO | Admitting: Physical Therapy

## 2018-10-04 ENCOUNTER — Ambulatory Visit: Payer: BC Managed Care – PPO | Admitting: Physical Therapy

## 2018-10-05 ENCOUNTER — Telehealth: Payer: Self-pay | Admitting: Physical Therapy

## 2018-10-05 NOTE — Telephone Encounter (Signed)
Called patient, left message on voicemail regarding missed appts.  Has an appt 10/1 and will DC patient if he no shows.  Notified him in the message of this plan.   Raeford Razor, PT 10/05/18 5:24 PM Phone: [phone] Fax: [phone]

## 2018-10-06 ENCOUNTER — Ambulatory Visit: Payer: BC Managed Care – PPO | Admitting: Physical Therapy

## 2019-04-04 ENCOUNTER — Other Ambulatory Visit: Payer: Self-pay

## 2019-04-04 ENCOUNTER — Encounter: Payer: Self-pay | Admitting: Family Medicine

## 2019-04-04 ENCOUNTER — Ambulatory Visit (INDEPENDENT_AMBULATORY_CARE_PROVIDER_SITE_OTHER): Payer: BC Managed Care – PPO | Admitting: Family Medicine

## 2019-04-04 VITALS — BP 168/120 | HR 91 | Ht 69.0 in | Wt 171.4 lb

## 2019-04-04 DIAGNOSIS — K625 Hemorrhage of anus and rectum: Secondary | ICD-10-CM | POA: Diagnosis not present

## 2019-04-04 DIAGNOSIS — I10 Essential (primary) hypertension: Secondary | ICD-10-CM

## 2019-04-04 DIAGNOSIS — Z23 Encounter for immunization: Secondary | ICD-10-CM

## 2019-04-04 MED ORDER — INDAPAMIDE 2.5 MG PO TABS: 2.5000 mg | ORAL_TABLET | Freq: Every day | ORAL | 3 refills | Status: DC

## 2019-04-04 NOTE — Patient Instructions (Addendum)
Thank you for coming to see me today. It was a pleasure to see you.   Please stop your hydrochlorothiazide. Please start taking indapamide.  You will take this once a day every day. Please continue your amlodipine and your losartan every day. If possible, I recommend purchasing a blood pressure cuff for her at home so you can monitor your blood pressures as this is the most reliable blood pressure reading. Please keep a log of your blood pressures until we meet again. We will follow up in 2 weeks for blood pressure recheck.   I will work on Pharmacologist paperwork.   I have sent an urgent referral to gastroenterology for further evaluation of your rectal bleeding.  I have obtained some labs today to further evaluate as well.  I will call you with the results.  If you have any questions or concerns, please do not hesitate to call the office at [phone].  Take Care,   Dr. Orpah Cobb, DO Resident Physician [facility] [phone]

## 2019-04-04 NOTE — Progress Notes (Signed)
Subjective:   Patient ID: Anthony Randolph    DOB: 23-Sep-1970, 49 y.o. male   MRN: [id number]  Anthony Randolph is a 49 y.o. male with a history of hypertension, CKD 2, tobacco abuse here for high blood pressure, arm and back pain.  HTN:  BP: (!) 170/110 today. Repeat BP: 168/120. Currently on Amlodipine 10mg  QD, Losartan 100mg  QD, HCTZ 25mg  QD. He notes he takes Amlodipine 10mg  QD and HCTZ 25mg  QD. He notes he hasn't been taking Losartan 100mg  QD but maybe 2x/week. Notes his blood pressure was up to 200's/100's. He notes occasional vision changes and headaches when his blood pressures are really high. Endorses occasional chest tightness on the left that occurs when he gets aroused or lifting something heavy, he notes it improves with stretching and massaging. Does not have chest pain with exertion majority of the time.  He is able to point to the exact location of the chest wall pain.  Denies any SOB. He notes he does a lot of heavy lifting at work which he thinks is affecting his blood pressures.  Bright Red Blood per Rectum: Patient notes he's been having bright red blood in his stool x months. Notes it is large amount of bright red blood. He notes intermittently throughout the month. Denies any abdominal pain. Denies ever having a colonoscopy. Unsure of family history of colon cancer. Patient has gained weight. Notes occasionally getting lightheaded if stand up quickly.  Review of Systems:  Per HPI.   Objective:   BP (!) 168/120 Comment: Lt arm 2nd attempt  Pulse 91   Ht 5\' 9"  (1.753 m)   Wt 171 lb 6 oz (77.7 kg)   SpO2 97%   BMI 25.31 kg/m  Vitals and nursing note reviewed.  General: Pleasant thin male, sitting comfortably in exam chair, well nourished, well developed, in no acute distress with non-toxic appearance Neck: supple, normal range of motion CV: regular rate and rhythm without murmurs, rubs, or gallops, no lower extremity edema, no carotid bruits appreciated, 2+ radial  pulses bilaterally Lungs: clear to auscultation bilaterally with normal work of breathing Skin: warm, dry Extremities: warm and well perfused MSK:  gait normal. Some discomfort to palpation of left chest wall Neuro: Alert and oriented, speech normal  Assessment & Plan:   Primary hypertension Chronic, uncontrolled.  Blood pressure is significantly elevated in today's visit.  Appears patient has not been taking the losartan regularly as prescribed.  Will transition patient from hydrochlorothiazide to indapamide, have patient start taking losartan every day, and continue amlodipine as prescribed.  BMP obtained and notable for worsening kidney function with creatinine of 1.52, up from 1.35.  We will have patient return in 2 weeks for blood pressure follow-up and repeat labs.  Recommended patient obtain a blood pressure cuff and keep a log of daily blood pressures.  Patient voiced understanding and agreement with plan. -If continues to be resistant will consider adding spironolactone as fourth agent  Bright red blood per rectum At the very end of the visit patient brought up that he has been experiencing intermittent episodes of large amounts of bright red blood per rectum.  He notes that this blood fills the toilet.  He denies any current bleeding at this time.  He has never had a colonoscopy.  Denies any personal or family history of colon cancer.  Denies any symptoms such as lightheadedness or dizziness.  CBC obtained and hemoglobin notable to be 15.7.  Urgent referral to GI for colonoscopy placed.  Patient given information to call and schedule as soon as possible.  Health Maintenance: TDAP given today Referral to GI for colonoscopy  Orders Placed This Encounter  Procedures  . Tdap vaccine greater than or equal to 7yo IM  . Comprehensive metabolic panel  . CBC  . Ambulatory referral to Gastroenterology    Referral Priority:   Urgent    Referral Type:   Consultation    Referral Reason:    Specialty Services Required    Number of Visits Requested:   1   Meds ordered this encounter  Medications  . indapamide (LOZOL) 2.5 MG tablet    Sig: Take 1 tablet (2.5 mg total) by mouth daily.    Dispense:  90 tablet    Refill:  3    Orpah Cobb, DO PGY-2, [facility] 04/07/2019 3:15 PM

## 2019-04-05 LAB — COMPREHENSIVE METABOLIC PANEL
ALT: 21 IU/L (ref 0–44)
AST: 27 IU/L (ref 0–40)
Albumin/Globulin Ratio: 1.6 (ref 1.2–2.2)
Albumin: 4.6 g/dL (ref 4.0–5.0)
Alkaline Phosphatase: 61 IU/L (ref 39–117)
BUN/Creatinine Ratio: 9 (ref 9–20)
BUN: 13 mg/dL (ref 6–24)
Bilirubin Total: 0.3 mg/dL (ref 0.0–1.2)
CO2: 22 mmol/L (ref 20–29)
Calcium: 9.7 mg/dL (ref 8.7–10.2)
Chloride: 105 mmol/L (ref 96–106)
Creatinine, Ser: 1.52 mg/dL — ABNORMAL HIGH (ref 0.76–1.27)
GFR calc Af Amer: 62 mL/min/{1.73_m2} (ref >59)
GFR calc non Af Amer: 53 mL/min/{1.73_m2} — ABNORMAL LOW (ref >59)
Globulin, Total: 2.8 g/dL (ref 1.5–4.5)
Glucose: 88 mg/dL (ref 65–99)
Potassium: 4.4 mmol/L (ref 3.5–5.2)
Sodium: 142 mmol/L (ref 134–144)
Total Protein: 7.4 g/dL (ref 6.0–8.5)

## 2019-04-05 LAB — CBC
Hematocrit: 45.3 % (ref 37.5–51.0)
Hemoglobin: 15.7 g/dL (ref 13.0–17.7)
MCH: 33.8 pg — ABNORMAL HIGH (ref 26.6–33.0)
MCHC: 34.7 g/dL (ref 31.5–35.7)
MCV: 97 fL (ref 79–97)
Platelets: 297 10*3/uL (ref 150–450)
RBC: 4.65 x10E6/uL (ref 4.14–5.80)
RDW: 12.8 % (ref 11.6–15.4)
WBC: 9.4 10*3/uL (ref 3.4–10.8)

## 2019-04-07 ENCOUNTER — Encounter: Payer: Self-pay | Admitting: Family Medicine

## 2019-04-07 DIAGNOSIS — K625 Hemorrhage of anus and rectum: Secondary | ICD-10-CM | POA: Insufficient documentation

## 2019-04-07 NOTE — Assessment & Plan Note (Addendum)
Chronic, uncontrolled.  Blood pressure is significantly elevated in today's visit.  Appears patient has not been taking the losartan regularly as prescribed.  Will transition patient from hydrochlorothiazide to indapamide, have patient start taking losartan every day, and continue amlodipine as prescribed.  BMP obtained and notable for worsening kidney function with creatinine of 1.52, up from 1.35.  We will have patient return in 2 weeks for blood pressure follow-up and repeat labs.  Recommended patient obtain a blood pressure cuff and keep a log of daily blood pressures.  Patient voiced understanding and agreement with plan. -If continues to be resistant will consider adding spironolactone as fourth agent

## 2019-04-07 NOTE — Assessment & Plan Note (Signed)
At the very end of the visit patient brought up that he has been experiencing intermittent episodes of large amounts of bright red blood per rectum.  He notes that this blood fills the toilet.  He denies any current bleeding at this time.  He has never had a colonoscopy.  Denies any personal or family history of colon cancer.  Denies any symptoms such as lightheadedness or dizziness.  CBC obtained and hemoglobin notable to be 15.7.  Urgent referral to GI for colonoscopy placed.  Patient given information to call and schedule as soon as possible.

## 2019-04-11 ENCOUNTER — Telehealth: Payer: Self-pay

## 2019-04-11 NOTE — Telephone Encounter (Signed)
Patient calls nurse line stating he gave PCP FMLA forms at last office visit. The patient is wanting an update on FMLA. Per patient, he is waiting to schedule his GI apt depending on FMLA. The patients work policy only allows for one medical visit per month. Please advise.

## 2019-04-12 ENCOUNTER — Other Ambulatory Visit: Payer: Self-pay | Admitting: Family Medicine

## 2019-04-12 DIAGNOSIS — I10 Essential (primary) hypertension: Secondary | ICD-10-CM

## 2019-04-12 MED ORDER — AMLODIPINE BESYLATE 10 MG PO TABS: 10.0000 mg | ORAL_TABLET | Freq: Every day | ORAL | 3 refills | Status: DC

## 2019-04-12 MED ORDER — LOSARTAN POTASSIUM 100 MG PO TABS: 100.0000 mg | ORAL_TABLET | Freq: Every day | ORAL | 3 refills | Status: DC

## 2019-04-12 NOTE — Telephone Encounter (Signed)
Spoke with pt and he wanted to see if you put his restrictions on the paper work. He said the number on there for it to be faxed. Please advise. Brittnae Aschenbrenner Bruna Potter, CMA

## 2019-04-12 NOTE — Telephone Encounter (Signed)
Please inform patient that I will have the forms at the front desk after lunch today. He may come by to pick them up at his earliest convenience. I apologize for the delay.

## 2019-04-13 NOTE — Telephone Encounter (Signed)
Please inform patient that forms have been faxed to appropriate fax number. Recommend he touch base with his work in a few days to confirm they received it. He should obtain a accurate fax number at that time as well.

## 2019-04-13 NOTE — Telephone Encounter (Signed)
Attempted to call patient with message from Dr. Mauri Reading.  No answer.  LVM to call office.  Glennie Hawk, CMA

## 2019-04-15 NOTE — Progress Notes (Signed)
Subjective:   Patient ID: Anthony Randolph    DOB: [birth year], 49 y.o. male   MRN: [id number]  Anthony Randolph is a 49 y.o. male with a history of hypertension, CKD stage II, tobacco abuse here for blood pressure follow-up.  Hypertension, uncontrolled: BP: (!) 145/80 today. Currently on Amlodipine 10mg  QD, Losartan 100mg  QD, and Indapamide 2.5mg  QD. Was discontinued from HCTZ at last visit given poor control. Endorses compliance. Denies any chest pain, SOB, vision changes, or headaches. Patient has not been keeping a BP log.   Tobacco Use: Currently smokes 10 cigarettes a day. Smoking off and on x 20 years. Interested in smoking cessation. Has previously tried just quitting cold [place] which was helpful but he notes stress leads to him smoking ("not being able to perform in bed", a "hard day at work"). Notes his motivation to quit is for "my health and my kids". He is interested in the nicotine patch.   Erectile Dysfunction: Patient notes he has an issue with obtaining an erection and maintaining erection. This has been occurring for "a few years".  This is significant bothersome for patient and has affected his personal life. IIEF-5 score of 6.  Rash: Patient complaining of a rash on his right proximal forearm times weeks to months.  He notes that it is very itchy.  He is used some of his daughters steroid cream because she has a history of eczema and felt some improvement.  Denies any other areas of rash.  Is also been trying to treat it with moisturizing daily.  Review of Systems:  Per HPI.   Objective:   BP (!) 145/80   Pulse 91   Ht 5\' 8"  (1.727 m)   Wt 169 lb (76.7 kg)   SpO2 98%   BMI 25.70 kg/m  Vitals and nursing note reviewed.  General: Pleasant older male, sitting comfortably on exam chair, well nourished, well developed, in no acute distress with non-toxic appearance CV: regular rate and rhythm without murmurs, rubs, or gallops, 2+ radial pulses bilaterally Lungs: clear  to auscultation bilaterally with normal work of breathing Skin: warm, dry, erythematous and hyperpigmented rough patch on posterior proximal forearm, no signs of infection, no bleeding or discharge Extremities: warm and well perfused MSK:  gait normal Neuro: Alert and oriented, speech normal       Assessment & Plan:   Primary hypertension Blood pressure still above goal but much improved today.  Tolerating new medication well.  BMP obtained today with improvement in kidney function from prior labs.  Electrolytes stable.  We will continue current medications at this time and continue to monitor.  We will plan to follow-up in 1 month for blood pressure recheck. -Continue amlodipine 10 mg daily, losartan 100 mg daily and indapamide 2.5 mg daily -Follow-up 1 month for blood pressure recheck -If continues to be resistant will consider adding spironolactone as fourth agent  Tobacco abuse Smokes ~10 cig/day x 20 years (10 pack year history). Does not qualify for lung cancer screen at this time. Patient was counseled on the risks of tobacco use and cessation strongly encouraged.  Patient is motivated to quit smoking.  He is interested in the nicotine patch plans to try to cut down to 5 cigarettes or less/day.  Prescription for nicotine patch sent to outpatient pharmacy.  Patient was instructed on use.  Follow-up in 1 month to evaluate success.   Erectile dysfunction International erectile dysfunction score of 6 indicating severe erectile dysfunction.  This is significantly bothersome  for patient.  Discussed lifestyle changes and reversible causes of ED including smoking cessation and blood pressure control.  He voiced understanding agreement to plan.  Patient does not have any contraindications for phosphodiesterase inhibitors.  Will do trial of sildenafil and evaluate for efficacy at follow-up visit in 1 month. -Rx for sildenafil 100 mg sent to pharmacy  Rash Appears most consistent with atopic  dermatitis. Discussed pathophysiology and recommended treatment including:   - Bathe and soak for 10 minutes in warm water once a day. Pat dry.   - Use mild, unscented soap for bathing - Immediately apply the below cream/ointment prescribed to red areas only. Wait 10 minutes and then apply moisturizers like Eucerin, Lubriderm, Cetaphil or Aquaphor twice a day all over.  - Moisturize skin 1-2x/day every day   To red/inflamed areas on the body (below the face and neck), apply:  Triamcinolone 0.1 % ointment twice a day as needed.  With ointments be careful to avoid the armpits and groin area.  Make a note of any foods that make eczema worse. Keep finger nails trimmed. When washing clothes, use a fragrance-free laundry detergent Recommend use of Air humidifier    Orders Placed This Encounter  Procedures  . Basic Metabolic Panel   Meds ordered this encounter  Medications  . sildenafil (VIAGRA) 100 MG tablet    Sig: Take 0.5-1 tablets (50-100 mg total) by mouth daily as needed for erectile dysfunction.    Dispense:  10 tablet    Refill:  11  . triamcinolone cream (KENALOG) 0.1 %    Sig: Apply 1 application topically 2 (two) times daily. For two weeks, then as needed.    Dispense:  30 g    Refill:  0    Orpah Cobb, DO PGY-2, [facility] 04/19/2019 2:46 PM

## 2019-04-17 ENCOUNTER — Encounter: Payer: Self-pay | Admitting: Nurse Practitioner

## 2019-04-18 ENCOUNTER — Ambulatory Visit (INDEPENDENT_AMBULATORY_CARE_PROVIDER_SITE_OTHER): Payer: BC Managed Care – PPO | Admitting: Family Medicine

## 2019-04-18 ENCOUNTER — Other Ambulatory Visit: Payer: Self-pay

## 2019-04-18 ENCOUNTER — Encounter: Payer: Self-pay | Admitting: Family Medicine

## 2019-04-18 VITALS — BP 145/80 | HR 91 | Ht 68.0 in | Wt 169.0 lb

## 2019-04-18 DIAGNOSIS — N529 Male erectile dysfunction, unspecified: Secondary | ICD-10-CM | POA: Diagnosis not present

## 2019-04-18 DIAGNOSIS — Z720 Tobacco use: Secondary | ICD-10-CM

## 2019-04-18 DIAGNOSIS — R21 Rash and other nonspecific skin eruption: Secondary | ICD-10-CM | POA: Diagnosis not present

## 2019-04-18 DIAGNOSIS — Z716 Tobacco abuse counseling: Secondary | ICD-10-CM

## 2019-04-18 DIAGNOSIS — I10 Essential (primary) hypertension: Secondary | ICD-10-CM

## 2019-04-18 MED ORDER — SILDENAFIL CITRATE 100 MG PO TABS: 50.0000 mg | ORAL_TABLET | Freq: Every day | ORAL | 11 refills | Status: DC | PRN

## 2019-04-18 MED FILL — NICOTINE 7 MG/24HR PATCH: 7 | 14 days supply | Qty: 14 | Fill #0

## 2019-04-18 NOTE — Patient Instructions (Signed)
Thank you for coming to see me today. It was a pleasure to see you.   Blood pressure is much complete improved.  Let us continue the current medications as they are and plan to follow-up in 1 month to see how things are still going.  I have called in a prescription for the nicotine patches.  You can pick these up at the [facility] health outpatient pharmacy that is it around the corner from this clinic.  Our goal will be to cut down to less than 5 cigarettes a day.  I have called in a medicine called sildenafil (name brand Viagra). You take this medication as needed 1 hour prior to sexual activity. May be taken up to 4 hours before sexual activity.  Please let me know if you have to have any side effects.  And remember that smoking and high blood pressure are big contributors to erectile dysfunction.  The faster we can improve both of those, the better improvement you will have.   Please follow-up with Dr. Mauri Reading in 1 month.  If you have any questions or concerns, please do not hesitate to call the office at [phone].  Take Care,   Dr. Orpah Cobb, DO Resident Physician [facility] [phone]

## 2019-04-19 DIAGNOSIS — R21 Rash and other nonspecific skin eruption: Secondary | ICD-10-CM | POA: Insufficient documentation

## 2019-04-19 LAB — BASIC METABOLIC PANEL
BUN/Creatinine Ratio: 10 (ref 9–20)
BUN: 15 mg/dL (ref 6–24)
CO2: 26 mmol/L (ref 20–29)
Calcium: 9.9 mg/dL (ref 8.7–10.2)
Chloride: 100 mmol/L (ref 96–106)
Creatinine, Ser: 1.43 mg/dL — ABNORMAL HIGH (ref 0.76–1.27)
GFR calc Af Amer: 66 mL/min/{1.73_m2} (ref >59)
GFR calc non Af Amer: 57 mL/min/{1.73_m2} — ABNORMAL LOW (ref >59)
Glucose: 95 mg/dL (ref 65–99)
Potassium: 3.9 mmol/L (ref 3.5–5.2)
Sodium: 141 mmol/L (ref 134–144)

## 2019-04-19 MED ORDER — TRIAMCINOLONE ACETONIDE 0.1 % EX CREA: 1.0000 "application " | TOPICAL_CREAM | Freq: Two times a day (BID) | CUTANEOUS | 0 refills | Status: DC

## 2019-04-19 NOTE — Assessment & Plan Note (Signed)
International erectile dysfunction score of 6 indicating severe erectile dysfunction.  This is significantly bothersome for patient.  Discussed lifestyle changes and reversible causes of ED including smoking cessation and blood pressure control.  He voiced understanding agreement to plan.  Patient does not have any contraindications for phosphodiesterase inhibitors.  Will do trial of sildenafil and evaluate for efficacy at follow-up visit in 1 month. -Rx for sildenafil 100 mg sent to pharmacy

## 2019-04-19 NOTE — Assessment & Plan Note (Signed)
Appears most consistent with atopic dermatitis. Discussed pathophysiology and recommended treatment including:   - Bathe and soak for 10 minutes in warm water once a day. Pat dry.   - Use mild, unscented soap for bathing - Immediately apply the below cream/ointment prescribed to red areas only. Wait 10 minutes and then apply moisturizers like Eucerin, Lubriderm, Cetaphil or Aquaphor twice a day all over.  - Moisturize skin 1-2x/day every day   To red/inflamed areas on the body (below the face and neck), apply:  Triamcinolone 0.1 % ointment twice a day as needed.  With ointments be careful to avoid the armpits and groin area.  Make a note of any foods that make eczema worse. Keep finger nails trimmed. When washing clothes, use a fragrance-free laundry detergent Recommend use of Air humidifier  

## 2019-04-19 NOTE — Assessment & Plan Note (Addendum)
Blood pressure still above goal but much improved today.  Tolerating new medication well.  BMP obtained today with improvement in kidney function from prior labs.  Electrolytes stable.  We will continue current medications at this time and continue to monitor.  We will plan to follow-up in 1 month for blood pressure recheck. -Continue amlodipine 10 mg daily, losartan 100 mg daily and indapamide 2.5 mg daily -Follow-up 1 month for blood pressure recheck -If continues to be resistant will consider adding spironolactone as fourth agent

## 2019-04-19 NOTE — Assessment & Plan Note (Signed)
Smokes ~10 cig/day x 20 years (10 pack year history). Does not qualify for lung cancer screen at this time. Patient was counseled on the risks of tobacco use and cessation strongly encouraged.  Patient is motivated to quit smoking.  He is interested in the nicotine patch plans to try to cut down to 5 cigarettes or less/day.  Prescription for nicotine patch sent to outpatient pharmacy.  Patient was instructed on use.  Follow-up in 1 month to evaluate success.

## 2019-04-27 ENCOUNTER — Encounter: Payer: Self-pay | Admitting: Nurse Practitioner

## 2019-04-27 ENCOUNTER — Ambulatory Visit (INDEPENDENT_AMBULATORY_CARE_PROVIDER_SITE_OTHER): Payer: BC Managed Care – PPO | Admitting: Nurse Practitioner

## 2019-04-27 VITALS — BP 140/110 | HR 76 | Temp 98.7°F | Ht 69.25 in | Wt 167.5 lb

## 2019-04-27 DIAGNOSIS — Z01818 Encounter for other preprocedural examination: Secondary | ICD-10-CM | POA: Diagnosis not present

## 2019-04-27 DIAGNOSIS — K625 Hemorrhage of anus and rectum: Secondary | ICD-10-CM | POA: Diagnosis not present

## 2019-04-27 MED ORDER — SUTAB 1479-225-188 MG PO TABS: 1.0000 | ORAL_TABLET | ORAL | 0 refills | Status: DC

## 2019-04-27 NOTE — Progress Notes (Signed)
Agree with assessment and plan as outlined.  
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
## 2019-04-27 NOTE — Patient Instructions (Addendum)
If you are age 48 or older, your body mass index should be between 23-30. Your Body mass index is 24.56 kg/m. If this is out of the aforementioned range listed, please consider follow up with your Primary Care Provider.  If you are age 91 or younger, your body mass index should be between 19-25. Your Body mass index is 24.56 kg/m. If this is out of the aformentioned range listed, please consider follow up with your Primary Care Provider.   You have been scheduled for a colonoscopy. Please follow written instructions given to you at your visit today.  Please pick up your prep supplies at the pharmacy within the next 1-3 days. If you use inhalers (even only as needed), please bring them with you on the day of your procedure.  Follow up pending colonscopy.

## 2019-05-11 ENCOUNTER — Encounter: Payer: Self-pay | Admitting: Gastroenterology

## 2019-05-18 ENCOUNTER — Telehealth: Payer: Self-pay | Admitting: Gastroenterology

## 2019-05-18 NOTE — Telephone Encounter (Signed)
Dr. Adela Lank, this patient cx'ed his colonoscopy 05/22/19 due to unable to request time off of work.  Pt stated that he will call back to reschedule.

## 2019-05-18 NOTE — Telephone Encounter (Signed)
Okay thanks for letting me know

## 2019-05-22 ENCOUNTER — Encounter: Payer: BC Managed Care – PPO | Admitting: Gastroenterology

## 2019-09-08 ENCOUNTER — Encounter: Payer: Self-pay | Admitting: Student in an Organized Health Care Education/Training Program

## 2019-09-08 ENCOUNTER — Ambulatory Visit (INDEPENDENT_AMBULATORY_CARE_PROVIDER_SITE_OTHER): Payer: BC Managed Care – PPO | Admitting: Student in an Organized Health Care Education/Training Program

## 2019-09-08 ENCOUNTER — Other Ambulatory Visit: Payer: Self-pay

## 2019-09-08 VITALS — BP 180/100 | HR 88 | Ht 70.0 in | Wt 165.4 lb

## 2019-09-08 DIAGNOSIS — I10 Essential (primary) hypertension: Secondary | ICD-10-CM | POA: Diagnosis not present

## 2019-09-08 DIAGNOSIS — Z720 Tobacco use: Secondary | ICD-10-CM

## 2019-09-08 DIAGNOSIS — G8929 Other chronic pain: Secondary | ICD-10-CM | POA: Diagnosis not present

## 2019-09-08 NOTE — Progress Notes (Signed)
   SUBJECTIVE:   CHIEF COMPLAINT / HPI:   BP-current medications are amlodipine and losartan and is adherent. Did not take today. Doesn't check BP at home but he has a cuff.  He will occasionally get his blood pressure checked at work has been 168/120 even while medicated.  He states that he occasionally will have blurred vision, headaches, transient chest pain and shortness of breath while he was working or at rest.  Chronic pain- intermittent. Right hip pain and lower back. Worse with heavy lifting at work. It is intermittent. Gets better with rest. Has a strenuous job. Has gone to physical therapy.  Requesting continued FMLA paperwork.   No current chest pain or SOB, LE edema.   Current smoker- 1ppw. Knows that he needs to stop.  OBJECTIVE:   BP (!) 180/100   Pulse 88   Ht 5\' 10"  (1.778 m)   Wt 165 lb 6.4 oz (75 kg)   SpO2 100%   BMI 23.73 kg/m   BP recheck was 210/120. General: NAD, pleasant, able to participate in exam Cardiac: RRR, normal heart sounds, no murmurs. 2+ radial and PT pulses bilaterally Respiratory: CTAB, normal effort, No wheezes, rales or rhonchi Abdomen: soft, nontender, nondistended, no hepatic or splenomegaly, +BS Extremities: no edema or cyanosis. WWP. Skin: warm and dry, no rashes noted Neuro: alert and oriented x4, no focal deficits Psych: Normal affect and mood  ASSESSMENT/PLAN:   Primary hypertension BP elevated to 180/100. Recheck was 200/120. Patient is currently asymptomatic. precepted with Dr. McDiarmid Difficult to assess therapy response since he did not take his medications today.  - recommended patient take his medications at home and keep a log for the next week. Patient endorses having a cuff at home but doesn't check his pressures currently.  - referred to pharmacy team for continuous BP monitoring and recommendations. Patient endorses agreement with this plan - counseled patient on HTN urgency symptoms and ED precautions - consider  secondary HTN workup such as sleep study, aldosteronism if not well controlled while adherent to medication regimen  Chronic pain FMLA paperwork given to PCP  Tobacco abuse Counseled patient on cessation and the increased risks of continuing. Declines medication assistance or other resources at this time     , DO [facility]

## 2019-09-08 NOTE — Patient Instructions (Signed)
It was a pleasure to see you today!  To summarize our discussion for this visit:  Your blood pressure is severely elevated today which is concerning and is probably related to not taking her blood pressure medicines today.  Is hard to say how well controlled your blood pressure is while you take your medicines since we do not have daily checks.  Please check your blood pressure at home and take your medicine every day.  I have referred you to our pharmacy team to start a blood pressure monitor so we can get a more accurate view of higher pressures look at home and that will help [place] better manage them.  I will pass along your FMLA paperwork to your PCP Dr. Mauri Reading.   Some additional health maintenance measures we should update are: Health Maintenance Due  Topic Date Due  . Hepatitis C Screening  Never done  . COVID-19 Vaccine (1) Never done  . INFLUENZA VACCINE  Never done  .   Call the clinic at [phone] if your symptoms worsen or you have any concerns.   Thank you for allowing me to take part in your care,  Dr. Jamelle Rushing

## 2019-09-11 DIAGNOSIS — G8929 Other chronic pain: Secondary | ICD-10-CM | POA: Insufficient documentation

## 2019-09-11 NOTE — Assessment & Plan Note (Addendum)
Counseled patient on cessation and the increased risks of continuing. Declines medication assistance or other resources at this time

## 2019-09-11 NOTE — Assessment & Plan Note (Signed)
FMLA paperwork given to PCP.

## 2019-09-11 NOTE — Assessment & Plan Note (Signed)
BP elevated to 180/100. Recheck was 200/120. Patient is currently asymptomatic. precepted with Dr. McDiarmid Difficult to assess therapy response since he did not take his medications today.  - recommended patient take his medications at home and keep a log for the next week. Patient endorses having a cuff at home but doesn't check his pressures currently.  - referred to pharmacy team for continuous BP monitoring and recommendations. Patient endorses agreement with this plan - counseled patient on HTN urgency symptoms and ED precautions - consider secondary HTN workup such as sleep study, aldosteronism if not well controlled while adherent to medication regimen

## 2019-09-12 ENCOUNTER — Telehealth: Payer: Self-pay | Admitting: *Deleted

## 2019-09-12 NOTE — Chronic Care Management (AMB) (Signed)
  Care Management   Note  09/12/2019 Name: Anthony Randolph MRN: [id number] DOB: [birth year]  [name] is a 49 y.o. year old male who is a primary care patient of [name], DO. I reached out to Marius Ditch by phone today in response to a referral sent by Mr. Pedram Dechaine's health plan.    Mr. Engel was given information about care management services today including:  1. Care management services include personalized support from designated clinical staff supervised by his physician, including individualized plan of care and coordination with other care providers 2. 24/7 contact phone numbers for assistance for urgent and routine care needs. 3. The patient may stop care management services at any time by phone call to the office staff.  Patient agreed to services and verbal consent obtained.   Follow up plan: Telephone appointment with care management team member scheduled for:09/13/2019  [facility] Guide, Embedded Care Coordination [facility] Management

## 2019-09-13 ENCOUNTER — Ambulatory Visit: Payer: BC Managed Care – PPO

## 2019-09-13 NOTE — Chronic Care Management (AMB) (Signed)
Care Management   Initial Visit Note  09/13/2019 Name: Anthony Randolph MRN: [id number] DOB: 1970/08/22   Assessment: Anthony Randolph is a 49 y.o. year old male who sees Ono, Dara Lords, DO for primary care. The care management team was consulted for assistance with care management and care coordination needs related to Disease Management Educational Needs HTN CKD Stage 2.   Review of patient status, including review of consultants reports, relevant laboratory and other test results, and collaboration with appropriate care team members and the patient's provider was performed as part of comprehensive patient evaluation and provision of care management services.    SDOH (Social Determinants of Health) assessments performed: No See Care Plan activities for detailed interventions related to [facility])     Outpatient Encounter Medications as of 09/13/2019  Medication Sig  . amLODipine (NORVASC) 10 MG tablet Take 1 tablet (10 mg total) by mouth at bedtime.  Marland Kitchen aspirin EC 81 MG tablet Take 1 tablet (81 mg total) by mouth daily.  . cyclobenzaprine (FLEXERIL) 10 MG tablet Take 1 tablet (10 mg total) by mouth 3 (three) times daily as needed for muscle spasms.  . diclofenac sodium (VOLTAREN) 1 % GEL Apply 2 g topically 4 (four) times daily.  . indapamide (LOZOL) 2.5 MG tablet Take 1 tablet (2.5 mg total) by mouth daily.  Marland Kitchen losartan (COZAAR) 100 MG tablet Take 1 tablet (100 mg total) by mouth daily.  . sildenafil (VIAGRA) 100 MG tablet Take 0.5-1 tablets (50-100 mg total) by mouth daily as needed for erectile dysfunction.  . Sodium Sulfate-Mag Sulfate-KCl (SUTAB) [id number] MG TABS Take 1 kit by mouth as directed.  . triamcinolone cream (KENALOG) 0.1 % Apply 1 application topically 2 (two) times daily. For two weeks, then as needed.   No facility-administered encounter medications on file as of 09/13/2019.    Goals Addressed              This Visit's Progress   .  I want to live to be here for my  kids and grand kids (pt-stated)        CARE PLAN ENTRY (see longitudinal plan of care for additional care plan information)  Current Barriers:  Marland Kitchen Knowledge Deficits related to basic understanding of hypertension pathophysiology and self care management . Non-adherence to prescribed medication regimen- patient states that he would take his medications one day and would forget to take his medications for  a day or two or check his blood pressure for a couple of days  Nurse Case Manager Clinical Goal(s):  Marland Kitchen Over the next 30 days, patient will verbalize understanding of plan for hypertension management . Over the next 60 days, patient will demonstrate improved adherence to prescribed treatment plan for hypertension as evidenced by taking all medications as prescribed, monitoring and recording blood pressure as directed, adhering to low sodium/DASH diet   Interventions:  . Evaluation of current treatment plan related to hypertension self management and patient's adherence to plan as established by provider. . Provided education to patient re: stroke prevention, s/s of heart attack and stroke, DASH diet, complications of uncontrolled blood pressure . Reviewed medications with patient and discussed importance of compliance . Discussed plans with patient for ongoing care management follow up and provided patient with direct contact information for care management team . Advised patient, providing education and rationale, to monitor blood pressure daily and record, calling PCP for findings outside established parameters.  . Patient checked his BP 4 pm yesterday it was 1617/105, this morning  before medication 740 am 151/109,  After medications 850 am 137/99.  Congratulated the patient for his efforts. . Goal set by the patient is to check BP twice daily and record values.  Monitor salt in his diet.  Take all his meds as prescribed.  Cut down on his smoking 4 cigarettes/week.  Walk for exercise.  Patient  Self Care Activities:  . Self administers medications as prescribed . Attends all scheduled provider appointments . Calls provider office for new concerns, questions, or BP outside discussed parameters . Checks BP and records as discussed . Follows a low sodium diet/DASH diet  Initial goal documentation          Follow up plan:  The care management team will reach out to the patient again over the next 14 days.   Anthony Randolph was given information about Care Management services today including:  1. Care Management services include personalized support from designated clinical staff supervised by a physician, including individualized plan of care and coordination with other care providers 2. 24/7 contact phone numbers for assistance for urgent and routine care needs. 3. The patient may stop Care Management services at any time (effective at the end of the month) by phone call to the office staff.  Patient agreed to services and verbal consent obtained.  Lazaro Arms RN, BSN, [facility] Care Management Coordinator [place] Phone: [phone] Fax: [phone]

## 2019-09-13 NOTE — Patient Instructions (Signed)
Visit Information  Goals Addressed              This Visit's Progress   .  I want to live to be here for my kids and grand kids (pt-stated)        CARE PLAN ENTRY (see longitudinal plan of care for additional care plan information)  Current Barriers:  Marland Kitchen Knowledge Deficits related to basic understanding of hypertension pathophysiology and self care management . Non-adherence to prescribed medication regimen- patient states that he would take his medications one day and would forget to take his medications for  a day or two or check his blood pressure for a couple of days  Nurse Case Manager Clinical Goal(s):  Marland Kitchen Over the next 30 days, patient will verbalize understanding of plan for hypertension management . Over the next 60 days, patient will demonstrate improved adherence to prescribed treatment plan for hypertension as evidenced by taking all medications as prescribed, monitoring and recording blood pressure as directed, adhering to low sodium/DASH diet   Interventions:  . Evaluation of current treatment plan related to hypertension self management and patient's adherence to plan as established by provider. . Provided education to patient re: stroke prevention, s/s of heart attack and stroke, DASH diet, complications of uncontrolled blood pressure . Reviewed medications with patient and discussed importance of compliance . Discussed plans with patient for ongoing care management follow up and provided patient with direct contact information for care management team . Advised patient, providing education and rationale, to monitor blood pressure daily and record, calling PCP for findings outside established parameters.  . Patient checked his BP 4 pm yesterday it was 1617/105, this morning before medication 740 am 151/109,  After medications 850 am 137/99.  Congratulated the patient for his efforts. . Goal set by the patient is to check BP twice daily and record values.  Monitor salt in  his diet.  Take all his meds as prescribed.  Cut down on his smoking 4 cigarettes/week.  Walk for exercise.  Patient Self Care Activities:  . Self administers medications as prescribed . Attends all scheduled provider appointments . Calls provider office for new concerns, questions, or BP outside discussed parameters . Checks BP and records as discussed . Follows a low sodium diet/DASH diet  Initial goal documentation         Mr. Paddock was given information about Care Management services today including:  1. Care Management services include personalized support from designated clinical staff supervised by his physician, including individualized plan of care and coordination with other care providers 2. 24/7 contact phone numbers for assistance for urgent and routine care needs. 3. The patient may stop CCM services at any time (effective at the end of the month) by phone call to the office staff.  Patient agreed to services and verbal consent obtained.   The patient verbalized understanding of instructions provided today and declined a print copy of patient instruction materials.   The care management team will reach out to the patient again over the next 14 days.   [name] RN, BSN, [facility] Care Management Coordinator [facility] Phone: [phone] Fax: [phone]

## 2019-09-14 ENCOUNTER — Telehealth: Payer: Self-pay | Admitting: Family Medicine

## 2019-09-14 NOTE — Telephone Encounter (Signed)
Short-term Disability Claim form dropped off for at front desk for completion.  Verified that patient section of form has been completed.  Last DOS/WCC with PCP was 09/14/19.  Placed form in team folder to be completed by clinical staff.  Machelle A McCain

## 2019-09-15 NOTE — Telephone Encounter (Signed)
Placed in MDs box to be filled out. Viridiana Spaid, CMA  

## 2019-09-27 ENCOUNTER — Telehealth: Payer: BC Managed Care – PPO

## 2019-09-27 ENCOUNTER — Telehealth: Payer: Self-pay

## 2019-09-27 NOTE — Telephone Encounter (Signed)
  Care Management   Outreach Note  09/27/2019 Name: Anthony Randolph MRN: [id number] DOB: [birth year]  Referred by: [name], DO Reason for referral : Appointment (HTN)   An unsuccessful telephone outreach was attempted today. The patient was referred to the case management team for assistance with care management and care coordination.   Follow Up Plan: A HIPAA compliant phone message was left for the patient providing contact information and requesting a return call.  The care management team will reach out to the patient again over the next 7-14 days.   [name] RN, BSN, [facility] Care Management Coordinator [facility] Phone: [phone] Fax: [phone]

## 2019-09-28 ENCOUNTER — Telehealth: Payer: Self-pay | Admitting: *Deleted

## 2019-09-28 NOTE — Chronic Care Management (AMB) (Signed)
  Care Management   Note  09/28/2019 Name: [name] MRN: [id number] DOB: Feb 08, 1970  Anthony Randolph is a 49 y.o. year old male who is a primary care patient of [name], DO and is actively engaged with the care management team. I reached out to Marius Ditch by phone today to assist with re-scheduling a follow up visit with the RN Case Manager  Follow up plan: Unsuccessful telephone outreach attempt made. A HIPAA compliant phone message was left for the patient providing contact information and requesting a return call.  The care management team will reach out to the patient again over the next 7 days.  If patient returns call to provider office, please advise to call Embedded Care Management Care Guide Gwenevere Ghazi at [phone].  Gwenevere Ghazi  Care Guide, Embedded Care Coordination [facility] Management

## 2019-10-02 NOTE — Telephone Encounter (Signed)
Called patient to inquire about reason for short-term disability. LMoVM awaiting call back.

## 2019-10-05 NOTE — Chronic Care Management (AMB) (Signed)
  Care Management   Note  10/05/2019 Name: Anthony Randolph MRN: [id number] DOB: December 25, 1970  Anthony Randolph is a 49 y.o. year old male who is a primary care patient of [name], DO and is actively engaged with the care management team. I reached out to Marius Ditch by phone today to assist with re-scheduling a follow up visit with the RN Case Manager.  Follow up plan: Unsuccessful telephone outreach attempt made. A HIPAA compliant phone message was left for the patient providing contact information and requesting a return call. The care management team will reach out to the patient again over the next 7 days. If patient returns call to provider office, please advise to call Embedded Care Management Care Guide Gwenevere Ghazi at [phone].  Gwenevere Ghazi  Care Guide, Embedded Care Coordination [facility] Management

## 2019-10-10 NOTE — Telephone Encounter (Signed)
Attempted to contact patient several times to further discuss short term disability and reason for request. I have completed his FMLA forms but given inability to talk with patient the Short Term Disability form was not completed. All forms were placed in front office.

## 2019-10-10 NOTE — Telephone Encounter (Signed)
Form completed and placed at front office.

## 2019-10-11 NOTE — Telephone Encounter (Signed)
FMLA faxed to [facility]. A copy was made for batch scanning and all forms placed up front for pick up.

## 2019-10-12 NOTE — Chronic Care Management (AMB) (Signed)
  Care Management   Note  10/12/2019 Name: Anthony Randolph MRN: [id number] DOB: 05-24-1970  Anthony Randolph is a 49 y.o. year old male who is a primary care patient of [name], DO and is actively engaged with the care management team. I reached out to Marius Ditch by phone today to assist with re-scheduling a follow up visit with the RN Case Manager.  Follow up plan: Unsuccessful telephone outreach attempt made. A HIPAA compliant phone message was left for the patient providing contact information and requesting a return call. Unable to make contact on outreach attempts x 3. PCP Mullis, Kiersten P, DO.  notified via routed documentation in medical record.  We have been unable to make contact with the patient for follow up. The care management team is available to follow up with the patient after provider conversation with the patient regarding recommendation for care management engagement and subsequent re-referral to the care management team. If patient returns call to provider office, please advise to call Embedded Care Management Care Guide Gwenevere Ghazi at [phone].  Gwenevere Ghazi  Care Guide, Embedded Care Coordination [facility] Management  Direct Dial: [phone]

## 2019-10-13 NOTE — Telephone Encounter (Signed)
3 unsuccessful outreach calls attempted to reschedule call with RN CM. Sent follow up message to provider.  Misty Stanley

## 2020-01-28 ENCOUNTER — Ambulatory Visit (HOSPITAL_COMMUNITY)
Admission: EM | Admit: 2020-01-28 | Discharge: 2020-01-28 | Disposition: A | Discharge status: Other Acute Inpatient Hospital | Payer: BC Managed Care – PPO

## 2020-01-28 ENCOUNTER — Emergency Department (HOSPITAL_COMMUNITY)
Admission: EM | Admit: 2020-01-28 | Discharge: 2020-01-29 | Disposition: A | Discharge status: Home / Self Care | Payer: BC Managed Care – PPO | Attending: Emergency Medicine | Admitting: Emergency Medicine

## 2020-01-28 ENCOUNTER — Other Ambulatory Visit: Payer: Self-pay

## 2020-01-28 ENCOUNTER — Encounter (HOSPITAL_COMMUNITY): Payer: Self-pay | Admitting: *Deleted

## 2020-01-28 DIAGNOSIS — I10 Essential (primary) hypertension: Secondary | ICD-10-CM | POA: Diagnosis not present

## 2020-01-28 DIAGNOSIS — R109 Unspecified abdominal pain: Secondary | ICD-10-CM | POA: Insufficient documentation

## 2020-01-28 DIAGNOSIS — R1033 Periumbilical pain: Secondary | ICD-10-CM | POA: Diagnosis not present

## 2020-01-28 DIAGNOSIS — Z5321 Procedure and treatment not carried out due to patient leaving prior to being seen by health care provider: Secondary | ICD-10-CM | POA: Insufficient documentation

## 2020-01-28 LAB — COMPREHENSIVE METABOLIC PANEL
ALT: 19 U/L (ref 0–44)
AST: 28 U/L (ref 15–41)
Albumin: 4.3 g/dL (ref 3.5–5.0)
Alkaline Phosphatase: 46 U/L (ref 38–126)
Anion gap: 11 (ref 5–15)
BUN: 5 mg/dL — ABNORMAL LOW (ref 6–20)
CO2: 25 mmol/L (ref 22–32)
Calcium: 9.9 mg/dL (ref 8.9–10.3)
Chloride: 99 mmol/L (ref 98–111)
Creatinine, Ser: 1.28 mg/dL — ABNORMAL HIGH (ref 0.61–1.24)
GFR, Estimated: >60 mL/min (ref >60)
Glucose, Bld: 124 mg/dL — ABNORMAL HIGH (ref 70–99)
Potassium: 4.2 mmol/L (ref 3.5–5.1)
Sodium: 135 mmol/L (ref 135–145)
Total Bilirubin: 0.8 mg/dL (ref 0.3–1.2)
Total Protein: 8.4 g/dL — ABNORMAL HIGH (ref 6.5–8.1)

## 2020-01-28 LAB — CBC
HCT: 46.3 % (ref 39.0–52.0)
Hemoglobin: 15.6 g/dL (ref 13.0–17.0)
MCH: 33.3 pg (ref 26.0–34.0)
MCHC: 33.7 g/dL (ref 30.0–36.0)
MCV: 98.7 fL (ref 80.0–100.0)
Platelets: 372 10*3/uL (ref 150–400)
RBC: 4.69 MIL/uL (ref 4.22–5.81)
RDW: 12.6 % (ref 11.5–15.5)
WBC: 14.4 10*3/uL — ABNORMAL HIGH (ref 4.0–10.5)
nRBC: 0 % (ref 0.0–0.2)

## 2020-01-28 LAB — LIPASE, BLOOD: Lipase: 34 U/L (ref 11–51)

## 2020-01-28 NOTE — Discharge Instructions (Addendum)
We will transport you to the ER now.

## 2020-01-28 NOTE — ED Triage Notes (Signed)
[place] ,PA at bed side to eval Pt.

## 2020-01-28 NOTE — ED Provider Notes (Signed)
Anthony Randolph presents with complaints of sudden onset of severe abdominal pain which he woke with early this morning. Caused him to vomit. No sweating. No known fevers. Doesn't radiate to his back arm or jaw. No shortness of breath . Denies any previous similar. Took his losartan this morning. Didn't take his amlodipine last night. No previous abdominal surgeries. Abdomen is more tight/ bloated than usual for him. Sitting feels worse, he stands up to provide some relief of his abdominal pain. On exam noted abdominal bloating with umbilical and epigastric tenderness. Hypertension noted. Patient agreeable to RN transport to the ER now for further evaluation and management of severe abdominal pain and hypertension now.       Georgetta Haber, NP 01/28/20 1306

## 2020-01-28 NOTE — ED Notes (Signed)
Pt called x3 for rollcall with no response. °

## 2020-01-28 NOTE — ED Triage Notes (Signed)
Pt reports ABD pain and vomiting  started after Mid night/ today. Pt reports ABD pain is central and pain is worse when sitting.

## 2020-01-28 NOTE — ED Triage Notes (Signed)
Arrived from [facility]; reported concern for appy; d/t sudden onset of pain,

## 2020-05-11 ENCOUNTER — Other Ambulatory Visit: Payer: Self-pay | Admitting: Family Medicine

## 2020-05-11 DIAGNOSIS — N529 Male erectile dysfunction, unspecified: Secondary | ICD-10-CM

## 2020-05-13 NOTE — Telephone Encounter (Signed)
Please have patient schedule annual visit as it has been >1 year since visit. Will not be able to provide any further refills until seen. Thank you.

## 2020-06-09 NOTE — Progress Notes (Signed)
Subjective:   Patient ID: Anthony Randolph    DOB: 07/31/1970, 50 y.o. male   MRN: [id number]  Estuardo Frisbee is a 50 y.o. male with a history of HTN, CKD II, chronic pain, ED, tobacco abuse here for HTN and rash  HTN:  BP: (!) 162/108 today. Currently on losartan 100mg  QD and Amlodipine 10mg  QD. Supposed to take Indapamide 2.5mg  QD but notes running out of this medicine for several months. Endorses compliance otherwise. Current everyday smoker. Denies any chest pain, SOB, vision changes, or headaches.   Lab Results  Component Value Date   CREATININE 1.31 (H) 06/12/2020   CREATININE 1.28 (H) 01/28/2020   CREATININE 1.43 (H) 04/18/2019   Left sided chest pain:  Patient complains of more frequent left sided chest tightness/pressure that seems to occur more than normal. Denies radiation. Can occur at rest and with exertion. Sometimes feels better when he rotates his left shoulder.  Had myocardial perfusion study in 2019 that was normal. Noted to be low risk and no ischemia. Notes the pain comes and goes. 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 Denies affect with food.  Currently tobacco user: 1.5 PPD  Family history of CAD in brother and and mother.   Endorsers snoring at night. Not sure if he stops breathing.   Review of Systems:  Per HPI.   Objective:   BP (!) 162/108   Pulse 88   Ht 5' 9.5" (1.765 m)   Wt 161 lb 6.4 oz (73.2 kg)   SpO2 98%   BMI 23.49 kg/m  Vitals and nursing note reviewed.  General: pleasant thin older male, sitting comfortably in exam chair, well nourished, well developed, in no acute distress with non-toxic appearance CV: regular rate and rhythm without murmurs, rubs, or gallops Lungs: clear to auscultation bilaterally with normal work of breathing on room air, speaking in full sentences Skin: warm, dry MSK: chest wall nontender to palpation, gait normal Neuro: Alert and oriented, speech normal  Assessment & Plan:   Primary  hypertension Chronic, uncontrolled in setting of medication non-adherence.   Asymptomatic. EKG with NSR without ischemic changes and appears similar to priors.  Asymptomatic. Refill of Indapamide provided. Follow up in 2 weeks for repeat BP and BMP.  Discussed return and ED precautions. Strongly encouraged smoking cessation. Sleep study ordered.  Stage 2 chronic kidney disease Chronic. BMP to monitor.   Tobacco abuse Chronic. Smokes 1 PPD. Interested in quiting. Due to acute concerns this was not able to be fully addressed. Message sent to pharmacy team to assist in smoking cessation resources.   Chest discomfort Acute on chronic. No current pain. Non-radiating left sided chest pain that occurs at rest and with exertion, improves with arm movements. Not reproducible and not related to food. Has had normal myocardial perfusion study in 2019. This is similar pain to his prior chest discomfort but is occurring more often. EKG with NSR but with nonspecific T wave changes that look similar to prior EKGs. Hemodynamically stable at this time except for elevated BP. Will focus on improving blood pressure which I suspect is contributing and also have him follow up with cardiology for evaluation. He plan to schedule as soon as able. Strongly encouraged smoking cessation and will have pharmacy team assist in nicotine replacement. Discussed ED precautions.   Orders Placed This Encounter  Procedures   Lipid Panel   Hepatitis C antibody   Basic Metabolic Panel   Basic Metabolic Panel    Standing Status:  Future    Standing Expiration Date:   06/12/2021   POCT glycosylated hemoglobin (Hb A1C)   EKG 12-Lead   Split night study    Standing Status:   Future    Standing Expiration Date:   06/12/2021    Order Specific Question:   Where should this test be performed:    Answer:   [facility]   Cpap titration    Standing Status:   Future    Standing Expiration Date:   06/12/2021    Order  Specific Question:   Where should this test be performed:    Answer:   [facility]   Meds ordered this encounter  Medications   indapamide (LOZOL) 2.5 MG tablet    Sig: Take 1 tablet (2.5 mg total) by mouth daily.    Dispense:  90 tablet    Refill:  3     Orpah Cobb, DO PGY-3, [facility] 06/13/2020 7:33 PM

## 2020-06-12 ENCOUNTER — Other Ambulatory Visit (HOSPITAL_COMMUNITY): Payer: BC Managed Care – PPO

## 2020-06-12 ENCOUNTER — Other Ambulatory Visit: Payer: Self-pay | Admitting: Family Medicine

## 2020-06-12 ENCOUNTER — Other Ambulatory Visit: Payer: Self-pay

## 2020-06-12 ENCOUNTER — Ambulatory Visit (INDEPENDENT_AMBULATORY_CARE_PROVIDER_SITE_OTHER): Payer: BC Managed Care – PPO | Admitting: Family Medicine

## 2020-06-12 ENCOUNTER — Ambulatory Visit (HOSPITAL_COMMUNITY)
Admission: RE | Admit: 2020-06-12 | Discharge: 2020-06-12 | Disposition: A | Discharge status: Home / Self Care | Payer: BC Managed Care – PPO | Source: Ambulatory Visit | Attending: Family Medicine | Admitting: Family Medicine

## 2020-06-12 VITALS — BP 162/108 | HR 88 | Ht 69.5 in | Wt 161.4 lb

## 2020-06-12 DIAGNOSIS — Z131 Encounter for screening for diabetes mellitus: Secondary | ICD-10-CM

## 2020-06-12 DIAGNOSIS — R0789 Other chest pain: Secondary | ICD-10-CM | POA: Diagnosis not present

## 2020-06-12 DIAGNOSIS — Z720 Tobacco use: Secondary | ICD-10-CM

## 2020-06-12 DIAGNOSIS — Z79899 Other long term (current) drug therapy: Secondary | ICD-10-CM

## 2020-06-12 DIAGNOSIS — R079 Chest pain, unspecified: Secondary | ICD-10-CM | POA: Insufficient documentation

## 2020-06-12 DIAGNOSIS — N182 Chronic kidney disease, stage 2 (mild): Secondary | ICD-10-CM | POA: Diagnosis not present

## 2020-06-12 DIAGNOSIS — Z13220 Encounter for screening for lipoid disorders: Secondary | ICD-10-CM

## 2020-06-12 DIAGNOSIS — I10 Essential (primary) hypertension: Secondary | ICD-10-CM

## 2020-06-12 DIAGNOSIS — N529 Male erectile dysfunction, unspecified: Secondary | ICD-10-CM

## 2020-06-12 DIAGNOSIS — Z1159 Encounter for screening for other viral diseases: Secondary | ICD-10-CM

## 2020-06-12 DIAGNOSIS — M25551 Pain in right hip: Secondary | ICD-10-CM

## 2020-06-12 LAB — POCT GLYCOSYLATED HEMOGLOBIN (HGB A1C): Hemoglobin A1C: 5.9 % — AB (ref 4.0–5.6)

## 2020-06-12 MED ORDER — INDAPAMIDE 2.5 MG PO TABS: 2.5000 mg | ORAL_TABLET | Freq: Every day | ORAL | 3 refills | Status: DC

## 2020-06-12 NOTE — Patient Instructions (Addendum)
I have refilled your Indapamide 2.5mg  daily. Continue your Losartan 100mg  daily and Amlodipine 10mg  daily. Please follow up in 2 weeks for repeat blood pressure check and labs  For your chest pain, your EKG doesn't appear changed. But I would like you to see the cardiologist just for there opinion.  Stopping smoking would be very important. I have reached out to our pharmacy team so they can reach out and discuss option to help you with this  I have also referred you for sleep study because this can play a big part in blood pressure and heart issues. Please expect a call from them to schedule.  I will work on paperwork.   [facility] at [facility] [address], [place], [address] [place] [phone]

## 2020-06-13 DIAGNOSIS — R0789 Other chest pain: Secondary | ICD-10-CM | POA: Insufficient documentation

## 2020-06-13 DIAGNOSIS — Z79899 Other long term (current) drug therapy: Secondary | ICD-10-CM | POA: Insufficient documentation

## 2020-06-13 LAB — LIPID PANEL
Chol/HDL Ratio: 2 ratio (ref 0.0–5.0)
Cholesterol, Total: 141 mg/dL (ref 100–199)
HDL: 69 mg/dL (ref >39)
LDL Chol Calc (NIH): 58 mg/dL (ref 0–99)
Triglycerides: 71 mg/dL (ref 0–149)
VLDL Cholesterol Cal: 14 mg/dL (ref 5–40)

## 2020-06-13 LAB — BASIC METABOLIC PANEL
BUN/Creatinine Ratio: 13 (ref 9–20)
BUN: 17 mg/dL (ref 6–24)
CO2: 20 mmol/L (ref 20–29)
Calcium: 9.6 mg/dL (ref 8.7–10.2)
Chloride: 104 mmol/L (ref 96–106)
Creatinine, Ser: 1.31 mg/dL — ABNORMAL HIGH (ref 0.76–1.27)
Glucose: 91 mg/dL (ref 65–99)
Potassium: 4.1 mmol/L (ref 3.5–5.2)
Sodium: 141 mmol/L (ref 134–144)
eGFR: 67 mL/min/{1.73_m2} (ref >59)

## 2020-06-13 LAB — HEPATITIS C ANTIBODY: Hep C Virus Ab: <0.1 s/co ratio (ref 0.0–0.9)

## 2020-06-13 MED ORDER — ROSUVASTATIN CALCIUM 20 MG PO TABS: 20.0000 mg | ORAL_TABLET | Freq: Every day | ORAL | 3 refills | Status: DC

## 2020-06-13 NOTE — Assessment & Plan Note (Signed)
The 10-year ASCVD risk score Denman George DC Jr., et al., 2013) is: 17.6% Patient open to starting Statin therapy. Start Crestor 20mg  QD Strongly encouraged smoking cessation. Healthy eating and exercise.  Working on better BP control

## 2020-06-13 NOTE — Assessment & Plan Note (Addendum)
Chronic, uncontrolled in setting of medication non-adherence.   Asymptomatic. EKG with NSR without ischemic changes and appears similar to priors.  Asymptomatic. Refill of Indapamide provided. Follow up in 2 weeks for repeat BP and BMP.  Discussed return and ED precautions. Strongly encouraged smoking cessation. Sleep study ordered.

## 2020-06-13 NOTE — Assessment & Plan Note (Signed)
Chronic. BMP to monitor.

## 2020-06-13 NOTE — Assessment & Plan Note (Addendum)
Chronic. Smokes 1 PPD. Interested in quiting. Due to acute concerns this was not able to be fully addressed. Message sent to pharmacy team to assist in smoking cessation resources.

## 2020-06-13 NOTE — Assessment & Plan Note (Signed)
Acute on chronic. No current pain. Non-radiating left sided chest pain that occurs at rest and with exertion, improves with arm movements. Not reproducible and not related to food. Has had normal myocardial perfusion study in 2019. This is similar pain to his prior chest discomfort but is occurring more often. EKG with NSR but with nonspecific T wave changes that look similar to prior EKGs. Hemodynamically stable at this time except for elevated BP. Will focus on improving blood pressure which I suspect is contributing and also have him follow up with cardiology for evaluation. He plan to schedule as soon as able. Strongly encouraged smoking cessation and will have pharmacy team assist in nicotine replacement. Discussed ED precautions.

## 2020-06-20 ENCOUNTER — Telehealth: Payer: Self-pay | Admitting: Pharmacist

## 2020-06-20 NOTE — Telephone Encounter (Signed)
Contacted patient regarding smoking cessation management, however pt did not answer. Left a HIPAA compliant voicemail requesting call back. Will call again in 1 week.

## 2020-06-26 ENCOUNTER — Other Ambulatory Visit: Payer: BC Managed Care – PPO

## 2020-06-26 ENCOUNTER — Ambulatory Visit: Payer: BC Managed Care – PPO

## 2020-07-03 ENCOUNTER — Other Ambulatory Visit: Payer: BC Managed Care – PPO

## 2020-07-03 ENCOUNTER — Telehealth: Payer: Self-pay | Admitting: Pharmacist

## 2020-07-03 NOTE — Telephone Encounter (Signed)
Patient contacted multiple times without success  for follow/up of tobacco intake reduction / tobacco cessation attempt.  Left message requesting return call.   F/U Phone call planned: 07/10/2020

## 2020-07-03 NOTE — Telephone Encounter (Signed)
-----   Message from [name], [place] sent at 06/12/2020  3:37 PM EDT ----- Patient is interested in smoking cessation. This was not able to be fully addressed during exam due to other acute concerns. He did voice interest in Chantix. Would you be able to reach out to him?

## 2020-07-04 ENCOUNTER — Other Ambulatory Visit: Payer: Self-pay

## 2020-07-04 ENCOUNTER — Ambulatory Visit: Payer: BC Managed Care – PPO

## 2020-07-04 ENCOUNTER — Other Ambulatory Visit: Payer: BC Managed Care – PPO

## 2020-07-04 VITALS — BP 138/82 | HR 90

## 2020-07-04 DIAGNOSIS — I10 Essential (primary) hypertension: Secondary | ICD-10-CM

## 2020-07-04 DIAGNOSIS — N182 Chronic kidney disease, stage 2 (mild): Secondary | ICD-10-CM

## 2020-07-04 NOTE — Progress Notes (Signed)
Patient here today for BP check.      Last BP was on 06/12/2020 and 162/108 HR 88.  BP today is 138/82 with a pulse of 90.    Checked BP in left arm with large cuff.    Symptoms present: None.   Patient last took BP med this morning. Routed note to PCP.      Patient has not started Indapamide- Patient instructed to pick up today and start. Patient agreed with plan.

## 2020-07-04 NOTE — Progress Notes (Signed)
Blood pressure much improved compared to last office visit.  Recommend he follow up in 1 month for continued monitirng.  Patient has FMLA forms completed approx ~4-6 months to allow him to attend PCP visits for blood pressure but fails to actually follow up. He presents with significantly elevated BP's. I will complete the FMLA forms this time with plan that he will follow up as recommended. If he continues to fail to do this, I would encourage PCP to no longer complete FMLA forms for this reason.

## 2020-07-05 ENCOUNTER — Telehealth: Payer: Self-pay

## 2020-07-05 ENCOUNTER — Encounter: Payer: Self-pay | Admitting: Family Medicine

## 2020-07-05 LAB — BASIC METABOLIC PANEL
BUN/Creatinine Ratio: 9 (ref 9–20)
BUN: 11 mg/dL (ref 6–24)
CO2: 27 mmol/L (ref 20–29)
Calcium: 9.6 mg/dL (ref 8.7–10.2)
Chloride: 93 mmol/L — ABNORMAL LOW (ref 96–106)
Creatinine, Ser: 1.26 mg/dL (ref 0.76–1.27)
Glucose: 121 mg/dL — ABNORMAL HIGH (ref 65–99)
Potassium: 3.9 mmol/L (ref 3.5–5.2)
Sodium: 136 mmol/L (ref 134–144)
eGFR: 70 mL/min/{1.73_m2} (ref >59)

## 2020-07-05 NOTE — Telephone Encounter (Signed)
Called patient and informed that FMLA paperwork had been completed. Patient requests that paperwork be faxed to his employer. Faxed to provided number.   Veronda Prude, RN

## 2020-07-10 ENCOUNTER — Telehealth: Payer: Self-pay | Admitting: Pharmacist

## 2020-07-10 NOTE — Telephone Encounter (Signed)
-----   Message from Kathrin Ruddy, RPH-CPP sent at 07/03/2020  1:45 PM EDT ----- Regarding: Tobacco Cessation

## 2020-07-10 NOTE — Telephone Encounter (Signed)
Attempted phone call at 4:35 as agreed.  No answer.   I provided options to set-up a 1:1 visit in clinic  OR provided best times to return a call in the future.   Provided both office number and my direct line for follow-up.

## 2020-07-10 NOTE — Telephone Encounter (Signed)
Contacted patient at the request of Dr. Mauri Reading She believed that patient was interested in quitting smoking but was unable to address at a recent visit.   Patient contacted while at work.  He states an interest in quitting.   We agreed that I would try to call after 4:30 later today.

## 2020-07-16 ENCOUNTER — Telehealth: Payer: Self-pay | Admitting: Pharmacist

## 2020-07-16 NOTE — Telephone Encounter (Signed)
Attempted to contact RE tobacco cessation.   Left message requesting call back at patient convenience.  Direct phone # shared.

## 2020-07-18 ENCOUNTER — Telehealth: Payer: Self-pay | Admitting: Pharmacist

## 2020-07-18 NOTE — Telephone Encounter (Signed)
-----   Message from Kathrin Ruddy, RPH-CPP sent at 07/16/2020  2:06 PM EDT ----- Regarding: tobacco cessation interest? Call late PM ? After 4:30 perhaps?

## 2020-07-18 NOTE — Telephone Encounter (Signed)
Patient contacted for follow/up of tobacco intake reduction / tobacco cessation attempt.   Final attempt to contact patient without success.  Asked patient to contact me directly at [facility] direct phone line.   HIPAA compliant message.   No additional F/U Phone call planned

## 2020-07-30 ENCOUNTER — Other Ambulatory Visit: Payer: Self-pay | Admitting: Family Medicine

## 2020-07-30 DIAGNOSIS — N529 Male erectile dysfunction, unspecified: Secondary | ICD-10-CM

## 2020-08-01 MED ORDER — SILDENAFIL CITRATE 100 MG PO TABS: ORAL_TABLET | ORAL | 0 refills | Status: DC

## 2020-08-01 NOTE — Telephone Encounter (Signed)
Patient returns call to nurse line regarding rx refill request. Informed that request had been denied due to needing appointment for BP follow up.   Patient reports that his last BP check was good. This was a nurse BP check on 6/30. BP was 138/82. Patient reports checking BP at home with systolic in the 120's and diastolic in the 80's.   Patient states that he will call back when he is able to make appointment. Patient reports difficulties in making appointments due to work schedule.   Veronda Prude, RN

## 2020-08-01 NOTE — Addendum Note (Signed)
Addended by: Manson Passey, Camya Haydon on: 08/01/2020 04:28 PM   Modules accepted: Orders

## 2020-08-30 ENCOUNTER — Ambulatory Visit (HOSPITAL_BASED_OUTPATIENT_CLINIC_OR_DEPARTMENT_OTHER): Payer: BC Managed Care – PPO | Attending: Family Medicine | Admitting: Internal Medicine

## 2020-09-26 ENCOUNTER — Other Ambulatory Visit: Payer: Self-pay | Admitting: Family Medicine

## 2020-09-26 DIAGNOSIS — I10 Essential (primary) hypertension: Secondary | ICD-10-CM

## 2020-09-27 ENCOUNTER — Other Ambulatory Visit: Payer: Self-pay

## 2020-09-27 DIAGNOSIS — N529 Male erectile dysfunction, unspecified: Secondary | ICD-10-CM

## 2020-09-30 NOTE — Telephone Encounter (Signed)
Patient is at work and he can not talk.  He states that he will call back to schedule appointment for refills.  Glennie Hawk, CMA

## 2020-12-11 ENCOUNTER — Other Ambulatory Visit: Payer: Self-pay

## 2020-12-11 ENCOUNTER — Encounter (HOSPITAL_COMMUNITY): Payer: Self-pay

## 2020-12-11 ENCOUNTER — Ambulatory Visit (INDEPENDENT_AMBULATORY_CARE_PROVIDER_SITE_OTHER): Payer: BC Managed Care – PPO

## 2020-12-11 ENCOUNTER — Ambulatory Visit (HOSPITAL_COMMUNITY)
Admission: EM | Admit: 2020-12-11 | Discharge: 2020-12-11 | Disposition: A | Discharge status: Home / Self Care | Payer: BC Managed Care – PPO | Attending: Emergency Medicine | Admitting: Emergency Medicine

## 2020-12-11 DIAGNOSIS — R1031 Right lower quadrant pain: Secondary | ICD-10-CM

## 2020-12-11 DIAGNOSIS — K5900 Constipation, unspecified: Secondary | ICD-10-CM | POA: Diagnosis not present

## 2020-12-11 DIAGNOSIS — N200 Calculus of kidney: Secondary | ICD-10-CM | POA: Diagnosis not present

## 2020-12-11 LAB — POCT URINALYSIS DIPSTICK, ED / UC
Bilirubin Urine: NEGATIVE
Glucose, UA: NEGATIVE mg/dL
Leukocytes,Ua: NEGATIVE
Nitrite: NEGATIVE
Protein, ur: 100 mg/dL — AB
Specific Gravity, Urine: 1.02 (ref 1.005–1.030)
Urobilinogen, UA: 0.2 mg/dL (ref 0.0–1.0)
pH: 5.5 (ref 5.0–8.0)

## 2020-12-11 MED ORDER — DICYCLOMINE HCL 20 MG PO TABS: 20.0000 mg | ORAL_TABLET | Freq: Two times a day (BID) | ORAL | 0 refills | Status: DC

## 2020-12-11 MED ORDER — TAMSULOSIN HCL 0.4 MG PO CAPS: 0.4000 mg | ORAL_CAPSULE | Freq: Every day | ORAL | 0 refills | Status: DC

## 2020-12-11 NOTE — ED Triage Notes (Signed)
Pt reports abdominal pain x 1 day.
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
## 2020-12-11 NOTE — Discharge Instructions (Addendum)
Take stool softener daily as needed or metamucil You will need to follow up with urology for kidney stones  Stay hydrated well  Take tylenol as needed for pain  If pain cont go to ER for  ct scan and further testing

## 2020-12-17 ENCOUNTER — Ambulatory Visit (INDEPENDENT_AMBULATORY_CARE_PROVIDER_SITE_OTHER): Payer: BC Managed Care – PPO | Admitting: Family Medicine

## 2020-12-17 ENCOUNTER — Other Ambulatory Visit: Payer: Self-pay

## 2020-12-17 ENCOUNTER — Encounter: Payer: Self-pay | Admitting: Family Medicine

## 2020-12-17 VITALS — BP 145/98 | HR 78 | Ht 70.0 in | Wt 161.1 lb

## 2020-12-17 DIAGNOSIS — K5900 Constipation, unspecified: Secondary | ICD-10-CM | POA: Diagnosis not present

## 2020-12-17 DIAGNOSIS — Z1211 Encounter for screening for malignant neoplasm of colon: Secondary | ICD-10-CM

## 2020-12-17 MED ORDER — POLYETHYLENE GLYCOL 3350 17 GM/SCOOP PO POWD: 17.0000 g | Freq: Every day | ORAL | 1 refills | Status: DC

## 2020-12-17 NOTE — Assessment & Plan Note (Signed)
-  symptoms seem likely secondary to severe constipation which appears to be chronic in nature, given recent imaging notable for concern for constipation, does not appear to be an acute process or consistent with diverticulitis or appendicitis. Does not appear to include renal involvement.   -miralax prescribed, medication instructions discussed -dedicated toilet time to develop routine and increasing fiber intake  -GI referral placed for colonoscopy as he is due to screening, counseling on importance  -follow up with PCP in 2 months

## 2020-12-17 NOTE — Progress Notes (Signed)
° ° °  SUBJECTIVE:   CHIEF COMPLAINT / HPI:   Anthony Randolph (MRN: [id number]) is a 50 y.o. male with a history of HTN, stage 2 CKD, and chronic pain who presents for urgent care follow up for abdominal pain.  Abdominal pain Patient was seen in the UC on 12/11/20 with RLQ abdominal pain for 1 day. He denied any abnormal bowel or bladder symptoms, groin pain, or nausea and vomiting. He did not take anything for the pain. At that time, his BP was elevated to 169/130 with pulse of 102. PE demonstrated abdominal distention and tenderness in RLQ and suprapubic area without rebound. UA showed trace ketones, small Hgb, and protein of 100. Abdominal XR demonstrated no acute findings with round calcification in right hemipelvis.   Today, patient reports continued pain in RLQ since his UC visit. The dicyclomine and tamsulosin he was prescribed at that visit have not helped. He feels his bowel movements have not been normal since last Wednesday (12/11/20) although has had these bowel movement pattern for awhile now. He passed "one stool ball" this morning but typically has BMs that appear as pellets. Unsure if he strains with bowel movements as he tries not to put pressure when going thus does not often get a full bowel movement. Denies history of anal fissures but endorsing history of hemorrhoids.    OBJECTIVE:   BP (!) 145/98    Pulse 78    Ht 5\' 10"  (1.778 m)    Wt 161 lb 2 oz (73.1 kg)    SpO2 96%    BMI 23.12 kg/m    PHYSICAL EXAM  GEN: Well-developed, in NAD HEAD: NCAT CVS: RRR, normal S1/S2, no murmurs, rubs, gallops RESP: Breathing comfortably on RA, no retractions, wheezes, rhonchi, or crackles ABD: Distended, firm to palpation diffusely especially in the RLQ, tenderness to light and deep palpation in RLQ and LLQ, normoactive bowel sounds PSY: Normal mood and affect, alert and oriented SKIN: No obvious lesions or rashes  EXT: Moves all extremities grossly equally    ASSESSMENT/PLAN:    Constipation -symptoms seem likely secondary to severe constipation which appears to be chronic in nature, given recent imaging notable for concern for constipation, does not appear to be an acute process or consistent with diverticulitis or appendicitis. Does not appear to include renal involvement.   -miralax prescribed, medication instructions discussed -dedicated toilet time to develop routine and increasing fiber intake  -GI referral placed for colonoscopy as he is due to screening, counseling on importance  -follow up with PCP in 2 months      , Medical Student [facility]  I was personally present and performed or re-performed the history, physical exam and medical decision making activities of this service and have verified that the service and findings are accurately documented in the students note.  Samantha Crimes, DO                  12/17/2020, 1:15 PM

## 2020-12-17 NOTE — Patient Instructions (Addendum)
It was great seeing you today!  Today we discussed your stomach pain, it seems you are very constipated. Please take 1 capful of miralax daily and mix it in water to drink. The goal is to have at least 1 soft, formed stool every 1-2 days. Make sure to eat more foods with high fiber content.  I have referred you to the GI specialist for a colonoscopy screening, if you do not hear from then within 1-2 weeks then please let us know so we can assist with scheduling.   Please follow up at your next scheduled appointment with your PCP, if anything arises between now and then, please don't hesitate to contact our office.   Thank you for allowing [place] to be a part of your medical care!  Thank you, Dr. Robyne Peers

## 2020-12-19 ENCOUNTER — Other Ambulatory Visit: Payer: Self-pay

## 2020-12-19 DIAGNOSIS — N529 Male erectile dysfunction, unspecified: Secondary | ICD-10-CM

## 2020-12-20 ENCOUNTER — Telehealth: Payer: Self-pay

## 2020-12-20 NOTE — Telephone Encounter (Signed)
Patient returns call to nurse line. Attempted to inform patient of below. Patient expresses frustration as he states "I only need a note stating I was seen by the doctor and do not have any restrictions to go back to work on Monday."   Patient also expresses frustration with visit on 12/13. Patient states that he was told in urgent care that pain was related to kidneys and was started on dicylcomine and tamsulosin. 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   Spoke with Dr. Robyne Peers. Advised that note could be provided for 12/13 only.   Will draft letter per provider.   Veronda Prude, RN

## 2020-12-20 NOTE — Telephone Encounter (Signed)
Patient calls nurse line requesting note for work to excuse absence on 12/13. Patient requesting return to work date on Monday, 12/19.   Please advise if letter can be provided.   Veronda Prude, RN

## 2020-12-23 ENCOUNTER — Other Ambulatory Visit: Payer: Self-pay

## 2020-12-23 ENCOUNTER — Ambulatory Visit (INDEPENDENT_AMBULATORY_CARE_PROVIDER_SITE_OTHER): Payer: BC Managed Care – PPO | Admitting: Family Medicine

## 2020-12-23 ENCOUNTER — Encounter: Payer: Self-pay | Admitting: Family Medicine

## 2020-12-23 VITALS — BP 162/102 | HR 92 | Ht 70.0 in | Wt 166.5 lb

## 2020-12-23 DIAGNOSIS — R1032 Left lower quadrant pain: Secondary | ICD-10-CM

## 2020-12-23 DIAGNOSIS — I10 Essential (primary) hypertension: Secondary | ICD-10-CM | POA: Diagnosis not present

## 2020-12-23 DIAGNOSIS — N529 Male erectile dysfunction, unspecified: Secondary | ICD-10-CM

## 2020-12-23 DIAGNOSIS — N400 Enlarged prostate without lower urinary tract symptoms: Secondary | ICD-10-CM

## 2020-12-23 DIAGNOSIS — K5900 Constipation, unspecified: Secondary | ICD-10-CM

## 2020-12-23 MED ORDER — AMLODIPINE BESYLATE 10 MG PO TABS: 10.0000 mg | ORAL_TABLET | Freq: Every day | ORAL | 0 refills | Status: DC

## 2020-12-23 MED ORDER — SILDENAFIL CITRATE 100 MG PO TABS: ORAL_TABLET | ORAL | 0 refills | Status: DC

## 2020-12-23 MED ORDER — LOSARTAN POTASSIUM 100 MG PO TABS: 100.0000 mg | ORAL_TABLET | Freq: Every day | ORAL | 0 refills | Status: DC

## 2020-12-23 NOTE — Patient Instructions (Addendum)
Thank you for coming to see me today. It was a pleasure.   Follow up with [facility] for abdominal pain.  Referral sent will call with appointment Follow up with [facility].  Referral sent will call with appointment  Please follow-up with PCP as needed  If you have any questions or concerns, please do not hesitate to call the office at [phone].  Best,   Dana Allan, MD    Irritable Bowel Syndrome, Adult Irritable bowel syndrome (IBS) is a group of symptoms that affects the organs responsible for digestion (gastrointestinal or GI tract). IBS is not one specific disease. To regulate how the GI tract works, the body sends signals back and forth between the intestines and the brain. If you have IBS, there may be a problem with these signals. As a result, the GI tract does not function normally. The intestines may become more sensitive and overreact to certain things. This may be especially true when you eat certain foods or when you are under stress. There are four types of IBS. These may be determined based on the consistency of your stool (feces): IBS with diarrhea. IBS with constipation. Mixed IBS. Unsubtyped IBS. It is important to know which type of IBS you have. Certain treatments are more likely to be helpful for certain types of IBS. What are the causes? The exact cause of IBS is not known. What increases the risk? You may have a higher risk for IBS if you: Are male. Are younger than 37. Have a family history of IBS. Have a mental health condition, such as depression, anxiety, or post-traumatic stress disorder. Have had a bacterial infection of your GI tract. What are the signs or symptoms? Symptoms of IBS vary from person to person. The main symptom is abdominal pain or discomfort. Other symptoms usually include one or more of the following: Diarrhea, constipation, or both. Abdominal swelling or bloating. Feeling full after eating a small or regular-sized  meal. Frequent gas. Mucus in the stool. A feeling of having more stool left after a bowel movement. Symptoms tend to come and go. They may be triggered by stress, mental health conditions, or certain foods. How is this diagnosed? This condition may be diagnosed based on a physical exam, your medical history, and your symptoms. You may have tests, such as: Blood tests. Stool test. X-rays. CT scan. Colonoscopy. This is a procedure in which your GI tract is viewed with a long, thin, flexible tube. How is this treated? There is no cure for IBS, but treatment can help relieve symptoms. Treatment depends on the type of IBS you have, and may include: Changes to your diet, such as: Avoiding foods that cause symptoms. Drinking more water. Following a low-FODMAP (fermentable oligosaccharides, disaccharides, monosaccharides, and polyols) diet for up to 6 weeks, or as told by your health care provider. FODMAPs are sugars that are hard for some people to digest. Eating more fiber. Eating medium-sized meals at the same times every day. Medicines. These may include: Fiber supplements, if you have constipation. Medicine to control diarrhea (antidiarrheal medicines). Medicine to help control muscle tightening (spasms) in your GI tract (antispasmodic medicines). Medicines to help with mental health conditions, such as antidepressants or tranquilizers. Talk therapy or counseling. Working with a diet and nutrition specialist (dietitian) to help create a food plan that is right for you. Managing your stress. Follow these instructions at home: Eating and drinking Eat a healthy diet. Eat medium-sized meals at about the same time every day.  Do not eat large meals. Gradually eat more fiber-rich foods. These include whole grains, fruits, and vegetables. This may be especially helpful if you have IBS with constipation. Eat a diet low in FODMAPs. Drink enough fluid to keep your urine pale yellow. Keep a  journal of foods that seem to trigger symptoms. Avoid foods and drinks that: Contain added sugar. Make your symptoms worse. Dairy products, caffeinated drinks, and carbonated drinks can make symptoms worse for some people. General instructions Take over-the-counter and prescription medicines and supplements only as told by your health care provider. Get enough exercise. Do at least 150 minutes of moderate-intensity exercise each week. Manage your stress. Getting enough sleep and exercise can help you manage stress. Keep all follow-up visits as told by your health care provider and therapist. This is important. Alcohol Use Do not drink alcohol if: Your health care provider tells you not to drink. You are pregnant, may be pregnant, or are planning to become pregnant. If you drink alcohol, limit how much you have: 0-1 drink a day for women. 0-2 drinks a day for men. Be aware of how much alcohol is in your drink. In the [place] one drink equals one typical bottle of beer (12 oz), one-half glass of wine (5 oz), or one shot of hard liquor (1 oz). Contact a health care provider if you have: Constant pain. Weight loss. Difficulty or pain when swallowing. Diarrhea that gets worse. Get help right away if you have: Severe abdominal pain. Fever. Diarrhea with symptoms of dehydration, such as dizziness or dry mouth. Bright red blood in your stool. Stool that is black and tarry. Abdominal swelling. Vomiting that does not stop. Blood in your vomit. Summary Irritable bowel syndrome (IBS) is not one specific disease. It is a group of symptoms that affects digestion. Your intestines may become more sensitive and overreact to certain things. This may be especially true when you eat certain foods or when you are under stress. There is no cure for IBS, but treatment can help relieve symptoms. This information is not intended to replace advice given to you by your health care provider. Make sure you  discuss any questions you have with your health care provider. Document Revised: 08/17/2019 Document Reviewed: 08/24/2019 Elsevier Patient Education  2022 [employer].

## 2020-12-23 NOTE — Progress Notes (Signed)
° ° °  SUBJECTIVE:   CHIEF COMPLAINT / HPI: blood pressure check and discuss results of recent visit to office/UC  Reports was seen at [facility] for right side abdominal pain and back pain.  Had xrays done and would like to review those.  Reports medications prescribed helped a little with abdominal pain. Endorses some dizziness since starting Bentyl. Reports abdominal pain has slightly improved and has had increase in bowel movements but not significant amount.  Denies any fevers, nausea, vomiting, decrease in appetite or weight loss.    Hypertension Has been without antihypertensives for a while.  Denies any visual changes, chest pain or shortness of breath  Erectile Dysfunction Requesting refill for Sildenafil.  Previously followed by [facility].  Would like to have referral sent.   PERTINENT  PMH / PSH:  HTN Chronic pain Tobacco use EtOH use ED CKD stage 2 Constipation  OBJECTIVE:   BP (!) 162/102    Pulse 92    Ht 5\' 10"  (1.778 m)    Wt 166 lb 8 oz (75.5 kg)    SpO2 100%    BMI 23.89 kg/m    General: Alert, no acute distress.  Frustrated Cardio: Normal S1 and S2, RRR, no r/m/g Pulm: CTAB, normal work of breathing Abdomen: Bowel sounds normal. Abdomen soft and mild RLQ tender, no rebound  Extremities: No peripheral edema.   ASSESSMENT/PLAN:   Constipation Reviewed side effects of Bentyl. Patient wanting to continue current medications until evaluation by GI doctor.  CBC today Reviewed recent abdominal xrays with patient.  Stool burden and gas noted on right side as well as  mild DJD of spine and hips Referral placed for GI at patients request Increase fiber and hydration in diet Follow up with PCP as needed Strict return precautions provided  Primary hypertension Asymptomatic Antihypertensives reordered today Monitor BP at home CMet today Strict return precautions provided Follow up with PCP in 4 weeks  Erectile dysfunction Refill Sildenafil Referral sent to  [facility] at patients request     , MD [facility]

## 2020-12-24 LAB — COMPREHENSIVE METABOLIC PANEL
ALT: 13 IU/L (ref 0–44)
AST: 19 IU/L (ref 0–40)
Albumin/Globulin Ratio: 1.7 (ref 1.2–2.2)
Albumin: 4.4 g/dL (ref 4.0–5.0)
Alkaline Phosphatase: 49 IU/L (ref 44–121)
BUN/Creatinine Ratio: 6 — ABNORMAL LOW (ref 9–20)
BUN: 9 mg/dL (ref 6–24)
Bilirubin Total: 0.4 mg/dL (ref 0.0–1.2)
CO2: 23 mmol/L (ref 20–29)
Calcium: 9.7 mg/dL (ref 8.7–10.2)
Chloride: 103 mmol/L (ref 96–106)
Creatinine, Ser: 1.49 mg/dL — ABNORMAL HIGH (ref 0.76–1.27)
Globulin, Total: 2.6 g/dL (ref 1.5–4.5)
Glucose: 100 mg/dL — ABNORMAL HIGH (ref 70–99)
Potassium: 4.2 mmol/L (ref 3.5–5.2)
Sodium: 141 mmol/L (ref 134–144)
Total Protein: 7 g/dL (ref 6.0–8.5)
eGFR: 57 mL/min/{1.73_m2} — ABNORMAL LOW (ref >59)

## 2020-12-24 LAB — CBC
Hematocrit: 38 % (ref 37.5–51.0)
Hemoglobin: 14 g/dL (ref 13.0–17.7)
MCH: 34.5 pg — ABNORMAL HIGH (ref 26.6–33.0)
MCHC: 36.8 g/dL — ABNORMAL HIGH (ref 31.5–35.7)
MCV: 94 fL (ref 79–97)
Platelets: 416 10*3/uL (ref 150–450)
RBC: 4.06 x10E6/uL — ABNORMAL LOW (ref 4.14–5.80)
RDW: 11.3 % — ABNORMAL LOW (ref 11.6–15.4)
WBC: 8.2 10*3/uL (ref 3.4–10.8)

## 2020-12-25 ENCOUNTER — Telehealth: Payer: Self-pay | Admitting: Family Medicine

## 2020-12-25 ENCOUNTER — Encounter: Payer: Self-pay | Admitting: Family Medicine

## 2020-12-25 DIAGNOSIS — R1031 Right lower quadrant pain: Secondary | ICD-10-CM

## 2020-12-25 NOTE — Assessment & Plan Note (Signed)
Refill Sildenafil Referral sent to [facility] at patients request

## 2020-12-25 NOTE — Assessment & Plan Note (Addendum)
Asymptomatic Antihypertensives reordered today Monitor BP at home CMet today Strict return precautions provided Follow up with PCP in 4 weeks

## 2020-12-25 NOTE — Assessment & Plan Note (Addendum)
Reviewed side effects of Bentyl. Patient wanting to continue current medications until evaluation by GI doctor.  CBC today Reviewed recent abdominal xrays with patient.  Stool burden and gas noted on right side as well as  mild DJD of spine and hips Referral placed for GI at patients request Increase fiber and hydration in diet Follow up with PCP as needed Strict return precautions provided

## 2020-12-25 NOTE — Telephone Encounter (Signed)
Called patient to discuss results of recent blood work.  No answer.  Unable to LVM.  If patient returns call please let him know that would like to repeat blood work given may be inaccurate readings.  Will place order for CBC today.  Can schedule an appointment if wanting to discuss.    Dana Allan, MD [facility]

## 2020-12-31 NOTE — Telephone Encounter (Signed)
Called patient and left message for him to make an appointment with his Cardiologist for chest pain.  Glennie Hawk, CMA

## 2021-01-31 ENCOUNTER — Encounter: Payer: Self-pay | Admitting: Family Medicine

## 2021-02-10 ENCOUNTER — Ambulatory Visit (INDEPENDENT_AMBULATORY_CARE_PROVIDER_SITE_OTHER): Payer: BC Managed Care – PPO | Admitting: Student

## 2021-02-10 ENCOUNTER — Encounter: Payer: Self-pay | Admitting: Student

## 2021-02-10 ENCOUNTER — Other Ambulatory Visit: Payer: Self-pay

## 2021-02-10 VITALS — BP 147/94 | HR 79 | Ht 70.0 in | Wt 160.1 lb

## 2021-02-10 DIAGNOSIS — N182 Chronic kidney disease, stage 2 (mild): Secondary | ICD-10-CM

## 2021-02-10 DIAGNOSIS — R079 Chest pain, unspecified: Secondary | ICD-10-CM

## 2021-02-10 DIAGNOSIS — N529 Male erectile dysfunction, unspecified: Secondary | ICD-10-CM | POA: Diagnosis not present

## 2021-02-10 DIAGNOSIS — I10 Essential (primary) hypertension: Secondary | ICD-10-CM

## 2021-02-10 DIAGNOSIS — Z1211 Encounter for screening for malignant neoplasm of colon: Secondary | ICD-10-CM

## 2021-02-10 DIAGNOSIS — R0789 Other chest pain: Secondary | ICD-10-CM

## 2021-02-10 MED ORDER — SILDENAFIL CITRATE 100 MG PO TABS: ORAL_TABLET | ORAL | 0 refills | Status: DC

## 2021-02-10 MED ORDER — CLONIDINE HCL 0.1 MG/24HR TD PTWK: 0.1000 mg | MEDICATED_PATCH | TRANSDERMAL | 0 refills | Status: DC

## 2021-02-10 NOTE — Assessment & Plan Note (Signed)
-  Refill medication with 15 tablets

## 2021-02-10 NOTE — Assessment & Plan Note (Signed)
Patient with history of CKD II, will obtain BMP today -BMP

## 2021-02-10 NOTE — Assessment & Plan Note (Addendum)
Continues to complain of chest discomfort, has seen cardiology in the past, had stress test that was swtiched to pharmacologic because of patient's HTN during stress test. Results were low risk with EF 54%. EKG normal today -Continue to monitor

## 2021-02-10 NOTE — Assessment & Plan Note (Addendum)
Blood pressure remains elevated 147/94, despite being treated with 3 antihypertensives. Will add 4th, clonidine patch. -Continue antihypertensives (indapimide, amlodipine, losartan) -Clonidine patch 0.1 mg weekly -Follow up in 1 month -BMP today
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
## 2021-02-10 NOTE — Patient Instructions (Signed)
It was great to see you! Thank you for allowing me to participate in your care!  I recommend that you always bring your medications to each appointment as this makes it easy to ensure we are on the correct medications and helps [place] not miss when refills are needed.  Our plans for today:  - BMP for electrolyte issues - EKG today - Starting new medicine, Clonidine patch weekly - Make appointment to discuss Rash/ Aches/Pains  We are checking some labs today, I will call you if they are abnormal will send you a MyChart message or a letter if they are normal.  If you do not hear about your labs in the next 2 weeks please let us know.  Take care and seek immediate care sooner if you develop any concerns.   Dr. Holley Bouche, MD [place]

## 2021-02-11 LAB — BASIC METABOLIC PANEL
BUN/Creatinine Ratio: 9 (ref 9–20)
BUN: 14 mg/dL (ref 6–24)
CO2: 25 mmol/L (ref 20–29)
Calcium: 9.8 mg/dL (ref 8.7–10.2)
Chloride: 103 mmol/L (ref 96–106)
Creatinine, Ser: 1.55 mg/dL — ABNORMAL HIGH (ref 0.76–1.27)
Glucose: 95 mg/dL (ref 70–99)
Potassium: 4 mmol/L (ref 3.5–5.2)
Sodium: 140 mmol/L (ref 134–144)
eGFR: 55 mL/min/{1.73_m2} — ABNORMAL LOW (ref >59)

## 2021-02-11 NOTE — Addendum Note (Signed)
Addended by: Holley Bouche T on: 02/11/2021 04:53 PM   Modules accepted: Orders

## 2021-02-25 MED ORDER — CLONIDINE HCL 0.1 MG/24HR TD PTWK: 0.1000 mg | MEDICATED_PATCH | TRANSDERMAL | 0 refills | Status: DC

## 2021-02-25 NOTE — Addendum Note (Signed)
Addended by: Bess Kinds T on: 02/25/2021 04:29 PM   Modules accepted: Orders

## 2021-05-06 ENCOUNTER — Encounter: Payer: Self-pay | Admitting: Family Medicine

## 2021-05-06 ENCOUNTER — Ambulatory Visit (INDEPENDENT_AMBULATORY_CARE_PROVIDER_SITE_OTHER): Payer: BC Managed Care – PPO | Admitting: Family Medicine

## 2021-05-06 DIAGNOSIS — Z720 Tobacco use: Secondary | ICD-10-CM

## 2021-05-06 DIAGNOSIS — M25551 Pain in right hip: Secondary | ICD-10-CM

## 2021-05-06 DIAGNOSIS — Z79899 Other long term (current) drug therapy: Secondary | ICD-10-CM

## 2021-05-06 DIAGNOSIS — I10 Essential (primary) hypertension: Secondary | ICD-10-CM

## 2021-05-06 DIAGNOSIS — N529 Male erectile dysfunction, unspecified: Secondary | ICD-10-CM | POA: Diagnosis not present

## 2021-05-06 DIAGNOSIS — G8929 Other chronic pain: Secondary | ICD-10-CM

## 2021-05-06 MED ORDER — TRIAMCINOLONE ACETONIDE 0.5 % EX OINT: 1.0000 "application " | TOPICAL_OINTMENT | Freq: Two times a day (BID) | CUTANEOUS | 3 refills | Status: DC

## 2021-05-06 MED ORDER — CYCLOBENZAPRINE HCL 10 MG PO TABS: 10.0000 mg | ORAL_TABLET | Freq: Three times a day (TID) | ORAL | 1 refills | Status: AC | PRN

## 2021-05-06 MED ORDER — INDAPAMIDE 2.5 MG PO TABS: 2.5000 mg | ORAL_TABLET | Freq: Every day | ORAL | 3 refills | Status: DC

## 2021-05-06 MED ORDER — SILDENAFIL CITRATE 100 MG PO TABS: ORAL_TABLET | ORAL | 1 refills | Status: DC

## 2021-05-06 MED ORDER — ROSUVASTATIN CALCIUM 20 MG PO TABS: 20.0000 mg | ORAL_TABLET | Freq: Every day | ORAL | 3 refills | Status: DC

## 2021-05-06 MED ORDER — CLONIDINE 0.1 MG/24HR TD PTWK: 0.1000 mg | MEDICATED_PATCH | TRANSDERMAL | 1 refills | Status: DC

## 2021-05-06 MED ORDER — LOSARTAN POTASSIUM 100 MG PO TABS: 100.0000 mg | ORAL_TABLET | Freq: Every day | ORAL | 0 refills | Status: DC

## 2021-05-06 MED ORDER — DICLOFENAC SODIUM 1 % EX GEL: 2.0000 g | Freq: Four times a day (QID) | CUTANEOUS | 2 refills | Status: DC

## 2021-05-06 NOTE — Progress Notes (Signed)
? ? ?  SUBJECTIVE:  ? ?CHIEF COMPLAINT / HPI:  ? ?Hypertension  ?Seen on 2/6 Dr Marcina Millard for blood pressure.  Clonidine patch was added but he was not able to get from [facility].  Bmet GFR 55.  Has cuff at home did not bring in ? ?Chest pain ?No chest pain recently ? ?Tobacco  ?Smokes 10 per day.  Would like stop  ? ?PERTINENT  PMH / PSH: Out of work for one month for various reasons ?Asks to have FMLA paperwork to allow him to attend GI appts and due to his intermittent back pain and when his blood pressure is high ? ?OBJECTIVE:  ? ?BP (!) 142/92   Pulse 94   Wt 157 lb 12.8 oz (71.6 kg)   SpO2 97%   BMI 22.64 kg/m?   ?Heart - Regular rate and rhythm.  No murmurs, gallops or rubs.    ?Lungs:  Normal respiratory effort, chest expands symmetrically. Lungs are clear to auscultation, no crackles or wheezes. ?Extremities:  No cyanosis, edema, or deformity noted with good range of motion of all major joints.   ? ? ?ASSESSMENT/PLAN:  ? ?Primary hypertension ?Improved but not quite at goal.  [facility] to [facility].  Likely he would not need this if stops smoking.    ? ?Tobacco abuse ?Discussed this.  See after visit summary ? ? ?On statin therapy due to risk of future cardiovascular event ?Has been out of crestor.  Sent in refill.  Discussed importance of taking  ? ?Chronic pain ?Has pain in back and extremities thougtht to be arthritis.  Use flexaril and voltaren gel.  Requests FMLA paperwork  ?  ?Patient Instructions  ?Good to see you today - Thank you for coming in ? ?Things we discussed today: ? ?Your goal blood pressure is less than 140/90.  Check your blood pressure several times a week.  If regularly higher than this please let me know - either with MyChart or leaving a phone message. ?Next visit please bring in your blood pressure cuff.    ? ?Please always bring your medication bottles ? ?Use the ointment on the place on your arm twice a day  ? ?I will look at your FMLA paperwork  ? ?You need to stop  smoking! ? You could save $5 a day  ?Call [place] (1-800-QUIT-NOW)  ?for ideas ? Choose a quit date when you will stop completely. ? Get prepared by slowly cutting down. ? Find a substitute to use when you think you need a cigarette. ? If you wish to discuss nicotine replacement (patches, gum) please make an appointment  ? ?See Dr Marcina Millard in  one month  ? ? ?Lind Covert, MD ?[place]  ?

## 2021-05-06 NOTE — Assessment & Plan Note (Signed)
Improved but not quite at goal.  Sent clonidine patch to [facility].  Likely he would not need this if stops smoking.    ?

## 2021-05-06 NOTE — Assessment & Plan Note (Signed)
Has been out of crestor.  Sent in refill.  Discussed importance of taking  ?

## 2021-05-06 NOTE — Assessment & Plan Note (Addendum)
Has pain in back and extremities thougtht to be arthritis.  Use flexaril and voltaren gel.  Requests FMLA paperwork - I filled out FMLA stating he required intermittent leave from his job to attend medical visits about one time per month  ?

## 2021-05-06 NOTE — Patient Instructions (Addendum)
Good to see you today - Thank you for coming in ? ?Things we discussed today: ? ?Your goal blood pressure is less than 140/90.  Check your blood pressure several times a week.  If regularly higher than this please let me know - either with MyChart or leaving a phone message. ?Next visit please bring in your blood pressure cuff.    ? ?Please always bring your medication bottles ? ?Use the ointment on the place on your arm twice a day  ? ?I will look at your FMLA paperwork  ? ?You need to stop smoking! ? You could save $5 a day  ?Call [facility] Quit-Line (1-800-QUIT-NOW)  ?for ideas ? Choose a quit date when you will stop completely. ? Get prepared by slowly cutting down. ? Find a substitute to use when you think you need a cigarette. ? If you wish to discuss nicotine replacement (patches, gum) please make an appointment  ? ?See Dr Barbaraann Faster in  one month  ?

## 2021-05-06 NOTE — Assessment & Plan Note (Signed)
Discussed this.  See after visit summary ? ?

## 2021-05-07 ENCOUNTER — Other Ambulatory Visit: Payer: Self-pay | Admitting: Student

## 2021-05-07 DIAGNOSIS — N529 Male erectile dysfunction, unspecified: Secondary | ICD-10-CM

## 2021-05-08 ENCOUNTER — Telehealth: Payer: Self-pay

## 2021-05-08 NOTE — Telephone Encounter (Signed)
Left message with pt informing him that the pain gel prescribed can be purchased OTC. ?

## 2021-05-13 ENCOUNTER — Telehealth: Payer: Self-pay

## 2021-05-13 ENCOUNTER — Other Ambulatory Visit: Payer: Self-pay | Admitting: Student

## 2021-05-13 DIAGNOSIS — N529 Male erectile dysfunction, unspecified: Secondary | ICD-10-CM

## 2021-05-13 NOTE — Telephone Encounter (Signed)
FMLA forms placed in outgoing mail.  ? ?A copy was made for batch scanning.  ?

## 2021-05-23 ENCOUNTER — Telehealth: Payer: Self-pay

## 2021-05-23 NOTE — Telephone Encounter (Signed)
Pt left message requesting refills.  Pt needs viagra and flexeril refills sent to [facility] on [address]. Told pt the flexeril was already at that location... he says when he went to pick it up the pharmacy said they did not have it. Informed pt I would get a message to the nurse for these to possibly be refilled.

## 2021-05-28 ENCOUNTER — Telehealth: Payer: Self-pay

## 2021-05-28 DIAGNOSIS — Z79899 Other long term (current) drug therapy: Secondary | ICD-10-CM

## 2021-05-28 DIAGNOSIS — I10 Essential (primary) hypertension: Secondary | ICD-10-CM

## 2021-05-28 DIAGNOSIS — M25551 Pain in right hip: Secondary | ICD-10-CM

## 2021-05-28 DIAGNOSIS — N529 Male erectile dysfunction, unspecified: Secondary | ICD-10-CM

## 2021-05-28 NOTE — Telephone Encounter (Signed)
Patient calls nurse line requesting all medications sent on 5/2 be resent to [facility] only.   Patient reports he had some sent to [facility], however due to cost was unable to pick them up. Patient reports [facility] is more cost effective.   Patient reports he was able to get Triamcinolone.   I have pended all medications to [facility]   Will forward to PCP.

## 2021-05-30 MED ORDER — DICLOFENAC SODIUM 1 % EX GEL
2.0000 g | Freq: Four times a day (QID) | CUTANEOUS | 2 refills | Status: DC
Start: 2021-05-30 — End: 2021-06-30

## 2021-05-30 MED ORDER — SILDENAFIL CITRATE 100 MG PO TABS: ORAL_TABLET | ORAL | 1 refills | Status: DC

## 2021-05-30 MED ORDER — ROSUVASTATIN CALCIUM 20 MG PO TABS: 20.0000 mg | ORAL_TABLET | Freq: Every day | ORAL | 3 refills | Status: DC

## 2021-05-30 MED ORDER — INDAPAMIDE 2.5 MG PO TABS: 2.5000 mg | ORAL_TABLET | Freq: Every day | ORAL | 3 refills | Status: DC

## 2021-05-30 MED ORDER — CLONIDINE 0.1 MG/24HR TD PTWK: 0.1000 mg | MEDICATED_PATCH | TRANSDERMAL | 1 refills | Status: DC

## 2021-05-30 MED ORDER — LOSARTAN POTASSIUM 100 MG PO TABS: 100.0000 mg | ORAL_TABLET | Freq: Every day | ORAL | 0 refills | Status: DC

## 2021-06-19 ENCOUNTER — Ambulatory Visit: Payer: BC Managed Care – PPO | Admitting: Family Medicine

## 2021-06-19 ENCOUNTER — Encounter: Payer: Self-pay | Admitting: Family Medicine

## 2021-06-19 VITALS — BP 120/80 | HR 99 | Wt 155.0 lb

## 2021-06-19 DIAGNOSIS — R1031 Right lower quadrant pain: Secondary | ICD-10-CM

## 2021-06-19 NOTE — Progress Notes (Unsigned)
    SUBJECTIVE:   CHIEF COMPLAINT / HPI: Abdominal pain  Patient reports right lower abdominal pain for 2 weeks.  Reports bloody stool daily.  Small BM yesterday.  Denies any fevers, nausea/vomiting, diarrhea or constipation.  Reports has not been eating well because he feels that this will increase the pain.  He is requesting CT or MRI for diagnosis of why he is bleeding.  Was referred to GI 12/22, patient reports he was unable to make any of the appointments.  He feels that he does not need a GI consult and just imaging.  He is not open to having a colonoscopy or an EGD currently.  PERTINENT  PMH / PSH:  Chronic constipation BPH Hypertension Tobacco use EtOH use THC use daily  OBJECTIVE:   BP 120/80   Pulse 99   Wt 155 lb (70.3 kg)   SpO2 98%   BMI 22.24 kg/m    General: Alert, no acute distress Abdomen: Bowel sounds normal. Abdomen soft, no distention.  Tenderness to light palpation on right abdominal area.  Negative Murphy's, negative rebound tenderness, negative cough sign, negative hop test Anoscope exam: Chaperone present; Paige Mucosa with bluish hue and large varicosities appreciated without bleeding.  No masses or polyps present. Extremities: No peripheral edema.    ASSESSMENT/PLAN:   Abdominal pain Suspect chronic constipation as possible etiology.  I suspect possible internal hemorrhoids causing bleeding.  Low suspicion for acute abdomen given no peritoneal signs on exam today. -CT abdomen/pelvis with contrast -CBC today -Recommend follow-up with GI for further evaluation.  Was not happy with recommending GI referral.  Feels that imaging will find out why he is bleeding.  Discussed with patient that obtaining CT of abdomen will not be today as would have to go through his insurance for approval, patient became upset and stated that he should have just went to the emergency department to have imaging done.  Tried to discuss with patient that not everything can be seen  on imaging and while I would order the imaging he would benefit from being evaluated by GI.  Unfortunately patient left visit unhappy with recommendations and did not get lab work today.  Given that GI had been previously referred and had called patient multiple times for appointment patient will now need to call to schedule an appointment for GI. Recommend PCP to follow-up with patient     Dana Allan, MD [facility]

## 2021-06-22 ENCOUNTER — Encounter: Payer: Self-pay | Admitting: Family Medicine

## 2021-06-22 DIAGNOSIS — R109 Unspecified abdominal pain: Secondary | ICD-10-CM | POA: Insufficient documentation

## 2021-06-22 NOTE — Assessment & Plan Note (Addendum)
Suspect chronic constipation as possible etiology.  I suspect possible internal hemorrhoids causing bleeding.  Low suspicion for acute abdomen given no peritoneal signs on exam today. -CT abdomen/pelvis with contrast -CBC today -Recommend follow-up with GI for further evaluation.  Was not happy with recommending GI referral.  Feels that imaging will find out why he is bleeding.  Discussed with patient that obtaining CT of abdomen will not be today as would have to go through his insurance for approval, patient became upset and stated that he should have just went to the emergency department to have imaging done.  Tried to discuss with patient that not everything can be seen on imaging and while I would order the imaging he would benefit from being evaluated by GI.  Unfortunately patient left visit unhappy with recommendations and did not get lab work today.  Given that GI had been previously referred and had called patient multiple times for appointment patient will now need to call to schedule an appointment for GI. Recommend PCP to follow-up with patient

## 2021-06-23 ENCOUNTER — Encounter (HOSPITAL_BASED_OUTPATIENT_CLINIC_OR_DEPARTMENT_OTHER): Payer: Self-pay | Admitting: Emergency Medicine

## 2021-06-23 ENCOUNTER — Other Ambulatory Visit: Payer: Self-pay

## 2021-06-23 ENCOUNTER — Emergency Department (HOSPITAL_COMMUNITY): Payer: BC Managed Care – PPO

## 2021-06-23 ENCOUNTER — Emergency Department (HOSPITAL_COMMUNITY)
Admission: EM | Admit: 2021-06-23 | Discharge: 2021-06-23 | Discharge status: Against Medical Advice | Payer: BC Managed Care – PPO | Source: Home / Self Care | Attending: Emergency Medicine | Admitting: Emergency Medicine

## 2021-06-23 DIAGNOSIS — D72829 Elevated white blood cell count, unspecified: Secondary | ICD-10-CM | POA: Insufficient documentation

## 2021-06-23 DIAGNOSIS — R748 Abnormal levels of other serum enzymes: Secondary | ICD-10-CM | POA: Insufficient documentation

## 2021-06-23 DIAGNOSIS — K5900 Constipation, unspecified: Secondary | ICD-10-CM | POA: Insufficient documentation

## 2021-06-23 DIAGNOSIS — K3532 Acute appendicitis with perforation and localized peritonitis, without abscess: Secondary | ICD-10-CM | POA: Diagnosis present

## 2021-06-23 DIAGNOSIS — R42 Dizziness and giddiness: Secondary | ICD-10-CM | POA: Insufficient documentation

## 2021-06-23 DIAGNOSIS — D720 Genetic anomalies of leukocytes: Secondary | ICD-10-CM | POA: Insufficient documentation

## 2021-06-23 DIAGNOSIS — Z79899 Other long term (current) drug therapy: Secondary | ICD-10-CM | POA: Insufficient documentation

## 2021-06-23 DIAGNOSIS — K625 Hemorrhage of anus and rectum: Secondary | ICD-10-CM

## 2021-06-23 DIAGNOSIS — Z7982 Long term (current) use of aspirin: Secondary | ICD-10-CM | POA: Insufficient documentation

## 2021-06-23 DIAGNOSIS — I10 Essential (primary) hypertension: Secondary | ICD-10-CM | POA: Insufficient documentation

## 2021-06-23 DIAGNOSIS — K3533 Acute appendicitis with perforation and localized peritonitis, with abscess: Secondary | ICD-10-CM | POA: Diagnosis not present

## 2021-06-23 DIAGNOSIS — R1031 Right lower quadrant pain: Principal | ICD-10-CM

## 2021-06-23 HISTORY — DX: Chronic kidney disease, unspecified: N18.9

## 2021-06-23 LAB — CBC WITH DIFFERENTIAL/PLATELET
Abs Immature Granulocytes: 0.1 10*3/uL — ABNORMAL HIGH (ref 0.00–0.07)
Basophils Absolute: 0 10*3/uL (ref 0.0–0.1)
Basophils Relative: 0 %
Eosinophils Absolute: 0.1 10*3/uL (ref 0.0–0.5)
Eosinophils Relative: 0 %
HCT: 39.7 % (ref 39.0–52.0)
Hemoglobin: 13.7 g/dL (ref 13.0–17.0)
Immature Granulocytes: 1 %
Lymphocytes Relative: 8 %
Lymphs Abs: 1.5 10*3/uL (ref 0.7–4.0)
MCH: 33 pg (ref 26.0–34.0)
MCHC: 34.5 g/dL (ref 30.0–36.0)
MCV: 95.7 fL (ref 80.0–100.0)
Monocytes Absolute: 1.4 10*3/uL — ABNORMAL HIGH (ref 0.1–1.0)
Monocytes Relative: 8 %
Neutro Abs: 15.2 10*3/uL — ABNORMAL HIGH (ref 1.7–7.7)
Neutrophils Relative %: 83 %
Platelets: 546 10*3/uL — ABNORMAL HIGH (ref 150–400)
RBC: 4.15 MIL/uL — ABNORMAL LOW (ref 4.22–5.81)
RDW: 12.5 % (ref 11.5–15.5)
WBC: 18.4 10*3/uL — ABNORMAL HIGH (ref 4.0–10.5)
nRBC: 0 % (ref 0.0–0.2)

## 2021-06-23 LAB — COMPREHENSIVE METABOLIC PANEL
ALT: 33 U/L (ref 0–44)
AST: 27 U/L (ref 15–41)
Albumin: 4.1 g/dL (ref 3.5–5.0)
Alkaline Phosphatase: 45 U/L (ref 38–126)
Anion gap: 10 (ref 5–15)
BUN: 23 mg/dL — ABNORMAL HIGH (ref 6–20)
CO2: 28 mmol/L (ref 22–32)
Calcium: 10.1 mg/dL (ref 8.9–10.3)
Chloride: 96 mmol/L — ABNORMAL LOW (ref 98–111)
Creatinine, Ser: 1.99 mg/dL — ABNORMAL HIGH (ref 0.61–1.24)
GFR, Estimated: 40 mL/min — ABNORMAL LOW (ref >60)
Glucose, Bld: 171 mg/dL — ABNORMAL HIGH (ref 70–99)
Potassium: 3.2 mmol/L — ABNORMAL LOW (ref 3.5–5.1)
Sodium: 134 mmol/L — ABNORMAL LOW (ref 135–145)
Total Bilirubin: 0.6 mg/dL (ref 0.3–1.2)
Total Protein: 8.3 g/dL — ABNORMAL HIGH (ref 6.5–8.1)

## 2021-06-23 LAB — LIPASE, BLOOD: Lipase: 67 U/L — ABNORMAL HIGH (ref 11–51)

## 2021-06-23 LAB — OCCULT BLOOD X 1 CARD TO LAB, STOOL: Fecal Occult Bld: NEGATIVE

## 2021-06-23 LAB — HIV ANTIBODY (ROUTINE TESTING W REFLEX): HIV Screen 4th Generation wRfx: NONREACTIVE

## 2021-06-23 MED ORDER — KCL IN DEXTROSE-NACL 20-5-0.45 MEQ/L-%-% IV SOLN: INTRAVENOUS | Status: DC

## 2021-06-23 MED ORDER — LORAZEPAM 1 MG PO TABS: 1.0000 mg | ORAL_TABLET | ORAL | Status: DC | PRN

## 2021-06-23 MED ORDER — SIMETHICONE 80 MG PO CHEW: 40.0000 mg | CHEWABLE_TABLET | Freq: Four times a day (QID) | ORAL | Status: DC | PRN

## 2021-06-23 MED ORDER — ENOXAPARIN SODIUM 40 MG/0.4ML IJ SOSY
40.0000 mg | PREFILLED_SYRINGE | INTRAMUSCULAR | Status: DC
  Administered 2021-06-23: 40 mg via SUBCUTANEOUS
  Filled 2021-06-23: qty 0.4

## 2021-06-23 MED ORDER — POTASSIUM CHLORIDE CRYS ER 20 MEQ PO TBCR
20.0000 meq | EXTENDED_RELEASE_TABLET | Freq: Once | ORAL | Status: AC
  Administered 2021-06-23: 20 meq via ORAL
  Filled 2021-06-23: qty 1

## 2021-06-23 MED ORDER — FOLIC ACID 1 MG PO TABS
1.0000 mg | ORAL_TABLET | Freq: Every day | ORAL | Status: DC
Start: 2021-06-23 — End: 2021-06-23
  Administered 2021-06-23: 1 mg via ORAL
  Filled 2021-06-23: qty 1

## 2021-06-23 MED ORDER — INDAPAMIDE 1.25 MG PO TABS: 2.5000 mg | ORAL_TABLET | Freq: Every day | ORAL | Status: DC

## 2021-06-23 MED ORDER — PIPERACILLIN-TAZOBACTAM 3.375 G IVPB: 3.3750 g | Freq: Three times a day (TID) | INTRAVENOUS | Status: DC

## 2021-06-23 MED ORDER — AMLODIPINE BESYLATE 5 MG PO TABS: 10.0000 mg | ORAL_TABLET | Freq: Every day | ORAL | Status: DC

## 2021-06-23 MED ORDER — ROSUVASTATIN CALCIUM 20 MG PO TABS: 20.0000 mg | ORAL_TABLET | Freq: Every day | ORAL | Status: DC

## 2021-06-23 MED ORDER — METRONIDAZOLE 500 MG/100ML IV SOLN
500.0000 mg | Freq: Once | INTRAVENOUS | Status: AC
  Administered 2021-06-23: 500 mg via INTRAVENOUS
  Filled 2021-06-23: qty 100

## 2021-06-23 MED ORDER — ACETAMINOPHEN 500 MG PO TABS
1000.0000 mg | ORAL_TABLET | Freq: Four times a day (QID) | ORAL | Status: DC
  Administered 2021-06-23: 1000 mg via ORAL
  Filled 2021-06-23: qty 2

## 2021-06-23 MED ORDER — MELATONIN 3 MG PO TABS: 3.0000 mg | ORAL_TABLET | Freq: Every evening | ORAL | Status: DC | PRN

## 2021-06-23 MED ORDER — LOSARTAN POTASSIUM 25 MG PO TABS: 100.0000 mg | ORAL_TABLET | Freq: Every day | ORAL | Status: DC

## 2021-06-23 MED ORDER — IOHEXOL 300 MG/ML  SOLN
100.0000 mL | Freq: Once | INTRAMUSCULAR | Status: AC | PRN
Start: 2021-06-23 — End: 2021-06-23
  Administered 2021-06-23: 80 mL via INTRAVENOUS

## 2021-06-23 MED ORDER — NICOTINE 14 MG/24HR TD PT24: 14.0000 mg | MEDICATED_PATCH | Freq: Every day | TRANSDERMAL | Status: DC

## 2021-06-23 MED ORDER — ONDANSETRON 4 MG PO TBDP: 4.0000 mg | ORAL_TABLET | Freq: Four times a day (QID) | ORAL | Status: DC | PRN

## 2021-06-23 MED ORDER — METOPROLOL TARTRATE 5 MG/5ML IV SOLN: 5.0000 mg | Freq: Four times a day (QID) | INTRAVENOUS | Status: DC | PRN

## 2021-06-23 MED ORDER — THIAMINE HCL 100 MG/ML IJ SOLN: 100.0000 mg | Freq: Every day | INTRAMUSCULAR | Status: DC

## 2021-06-23 MED ORDER — ONDANSETRON HCL 4 MG/2ML IJ SOLN
4.0000 mg | Freq: Four times a day (QID) | INTRAMUSCULAR | Status: DC | PRN
  Filled 2021-06-23: qty 2

## 2021-06-23 MED ORDER — HYDRALAZINE HCL 20 MG/ML IJ SOLN: 10.0000 mg | INTRAMUSCULAR | Status: DC | PRN

## 2021-06-23 MED ORDER — DIPHENHYDRAMINE HCL 50 MG/ML IJ SOLN: 25.0000 mg | Freq: Four times a day (QID) | INTRAMUSCULAR | Status: DC | PRN

## 2021-06-23 MED ORDER — DIPHENHYDRAMINE HCL 25 MG PO CAPS: 25.0000 mg | ORAL_CAPSULE | Freq: Four times a day (QID) | ORAL | Status: DC | PRN

## 2021-06-23 MED ORDER — LORAZEPAM 2 MG/ML IJ SOLN: 1.0000 mg | INTRAMUSCULAR | Status: DC | PRN

## 2021-06-23 MED ORDER — SODIUM CHLORIDE 0.9 % IV SOLN
2.0000 g | Freq: Once | INTRAVENOUS | Status: AC
  Administered 2021-06-23: 2 g via INTRAVENOUS
  Filled 2021-06-23: qty 20

## 2021-06-23 MED ORDER — MORPHINE SULFATE (PF) 2 MG/ML IV SOLN
1.0000 mg | INTRAVENOUS | Status: DC | PRN
  Administered 2021-06-23: 4 mg via INTRAVENOUS
  Filled 2021-06-23: qty 2

## 2021-06-23 MED ORDER — SODIUM CHLORIDE 0.9 % IV BOLUS
1000.0000 mL | Freq: Once | INTRAVENOUS | Status: AC
  Administered 2021-06-23: 1000 mL via INTRAVENOUS

## 2021-06-23 MED ORDER — THIAMINE HCL 100 MG PO TABS
100.0000 mg | ORAL_TABLET | Freq: Every day | ORAL | Status: DC
  Administered 2021-06-23: 100 mg via ORAL
  Filled 2021-06-23: qty 1

## 2021-06-23 MED ORDER — ADULT MULTIVITAMIN W/MINERALS CH
1.0000 | ORAL_TABLET | Freq: Every day | ORAL | Status: DC
  Administered 2021-06-23: 1 via ORAL
  Filled 2021-06-23: qty 1

## 2021-06-23 MED ORDER — AMOXICILLIN-POT CLAVULANATE 875-125 MG PO TABS: 1.0000 | ORAL_TABLET | Freq: Two times a day (BID) | ORAL | 0 refills | Status: DC

## 2021-06-23 MED ORDER — OXYCODONE HCL 5 MG PO TABS: 5.0000 mg | ORAL_TABLET | ORAL | Status: DC | PRN

## 2021-06-23 MED ORDER — TAMSULOSIN HCL 0.4 MG PO CAPS: 0.4000 mg | ORAL_CAPSULE | Freq: Every day | ORAL | Status: DC

## 2021-06-23 MED ORDER — CLONIDINE HCL 0.1 MG/24HR TD PTWK: 0.1000 mg | MEDICATED_PATCH | TRANSDERMAL | Status: DC

## 2021-06-23 NOTE — ED Notes (Signed)
Pt transferred to ED for CT scan due to RLQ

## 2021-06-23 NOTE — ED Triage Notes (Signed)
Pt arrives to ED with c/o abdominal pain x3 weeks and rectal bleeding for "years." He reports unintentional weight loss.

## 2021-06-23 NOTE — ED Provider Notes (Signed)
[facility] EMERGENCY DEPT Provider Note   CSN: [id number] Arrival date & time: 06/23/21  0849     History  Chief Complaint  Patient presents with   Abdominal Pain   Hematochezia    Anthony Randolph is a 51 y.o. male.  Patient is a 51 year old male with past medical history of hypertension and erectile dysfunction presenting for complaints of abdominal pain.  Patient admits to right lower quadrant abdominal pain, nonradiating, sharp, severe, intermittent, x3 weeks.  Patient admits to associated constipation and blood from rectum.  Admits to associated intermittent lightheadedness and dizziness.  None currently.  Denies fevers, chills, nausea, vomiting, diarrhea.  No previous abdominal surgical history.  No previous history of GI bleed.  The history is provided by the patient. No language interpreter was used.  Abdominal Pain Associated symptoms: constipation   Associated symptoms: no chest pain, no chills, no cough, no diarrhea, no dysuria, no fever, no hematuria, no nausea, no shortness of breath, no sore throat and no vomiting        Home Medications Prior to Admission medications   Medication Sig Start Date End Date Taking? Authorizing Provider  amLODipine (NORVASC) 10 MG tablet Take 1 tablet (10 mg total) by mouth at bedtime. PLEASE MAKE AN APPOINTMENT 12/23/20   Carollee Leitz, MD  aspirin EC 81 MG tablet Take 1 tablet (81 mg total) by mouth daily. Patient not taking: Reported on 12/17/2020 11/25/17   Steve Rattler, DO  cloNIDine (CATAPRES - DOSED IN MG/24 HR) 0.1 mg/24hr patch Place 1 patch (0.1 mg total) onto the skin once a week. 05/30/21   Holley Bouche, MD  cyclobenzaprine (FLEXERIL) 10 MG tablet Take 1 tablet (10 mg total) by mouth 3 (three) times daily as needed. for muscle spams 05/06/21   Lind Covert, MD  diclofenac Sodium (VOLTAREN) 1 % GEL Apply 2 g topically 4 (four) times daily. 05/30/21   Holley Bouche, MD  dicyclomine (BENTYL) 20 MG  tablet Take 1 tablet (20 mg total) by mouth 2 (two) times daily. 12/11/20   Marney Setting, NP  indapamide (LOZOL) 2.5 MG tablet Take 1 tablet (2.5 mg total) by mouth daily. 05/30/21   Holley Bouche, MD  losartan (COZAAR) 100 MG tablet Take 1 tablet (100 mg total) by mouth daily. 05/30/21   Holley Bouche, MD  polyethylene glycol powder (GLYCOLAX/MIRALAX) 17 GM/SCOOP powder Take 17 g by mouth daily. 12/17/20   Ganta, Anupa, DO  rosuvastatin (CRESTOR) 20 MG tablet Take 1 tablet (20 mg total) by mouth daily. 05/30/21   Holley Bouche, MD  sildenafil (VIAGRA) 100 MG tablet TAKE 1/2 TO 1 (ONE-HALF TO ONE) TABLET BY MOUTH ONCE DAILY AS NEEDED FOR  ERECTILE  DYSFUNCTION 05/30/21   Holley Bouche, MD  Sodium Sulfate-Mag Sulfate-KCl (SUTAB) [phone] MG TABS Take 1 kit by mouth as directed. 04/27/19   Willia Craze, NP  tamsulosin (FLOMAX) 0.4 MG CAPS capsule Take 1 capsule (0.4 mg total) by mouth daily. 12/11/20   Marney Setting, NP  triamcinolone ointment (KENALOG) 0.5 % Apply 1 application. topically 2 (two) times daily. 05/06/21   Lind Covert, MD      Allergies    Patient has no known allergies.    Review of Systems   Review of Systems  Constitutional:  Negative for chills and fever.  HENT:  Negative for ear pain and sore throat.   Eyes:  Negative for pain and visual disturbance.  Respiratory:  Negative for cough and shortness of breath.  Cardiovascular:  Negative for chest pain and palpitations.  Gastrointestinal:  Positive for abdominal pain, blood in stool and constipation. Negative for diarrhea, nausea and vomiting.  Genitourinary:  Negative for dysuria and hematuria.  Musculoskeletal:  Negative for arthralgias and back pain.  Skin:  Negative for color change and rash.  Neurological:  Negative for seizures and syncope.  All other systems reviewed and are negative.   Physical Exam Updated Vital Signs BP 138/87   Pulse 99   Temp 98.8 F (37.1 C) (Oral)   Resp  17   Ht _0  (1.727 m)   Wt 71.2 kg   SpO2 96%   BMI 23.87 kg/m  Physical Exam Vitals and nursing note reviewed. Exam conducted with a chaperone present.  Constitutional:      General: He is not in acute distress.    Appearance: He is well-developed.  HENT:     Head: Normocephalic and atraumatic.  Eyes:     Conjunctiva/sclera: Conjunctivae normal.  Cardiovascular:     Rate and Rhythm: Normal rate and regular rhythm.     Heart sounds: No murmur heard. Pulmonary:     Effort: Pulmonary effort is normal. No respiratory distress.     Breath sounds: Normal breath sounds.  Abdominal:     Palpations: Abdomen is soft.     Tenderness: There is abdominal tenderness in the right lower quadrant. There is no guarding or rebound.  Genitourinary:    Rectum: No tenderness, anal fissure, external hemorrhoid or internal hemorrhoid.  Musculoskeletal:        General: No swelling.     Cervical back: Neck supple.  Skin:    General: Skin is warm and dry.     Capillary Refill: Capillary refill takes less than 2 seconds.  Neurological:     Mental Status: He is alert.  Psychiatric:        Mood and Affect: Mood normal.     ED Results / Procedures / Treatments   Labs (all labs ordered are listed, but only abnormal results are displayed) Labs Reviewed  CBC WITH DIFFERENTIAL/PLATELET - Abnormal; Notable for the following components:      Result Value   WBC 18.4 (*)    RBC 4.15 (*)    Platelets 546 (*)    Neutro Abs 15.2 (*)    Monocytes Absolute 1.4 (*)    Abs Immature Granulocytes 0.10 (*)    All other components within normal limits  COMPREHENSIVE METABOLIC PANEL - Abnormal; Notable for the following components:   Sodium 134 (*)    Potassium 3.2 (*)    Chloride 96 (*)    Glucose, Bld 171 (*)    BUN 23 (*)    Creatinine, Ser 1.99 (*)    Total Protein 8.3 (*)    GFR, Estimated 40 (*)    All other components within normal limits  LIPASE, BLOOD - Abnormal; Notable for the following  components:   Lipase 67 (*)    All other components within normal limits  OCCULT BLOOD X 1 CARD TO LAB, STOOL  URINALYSIS, ROUTINE W REFLEX MICROSCOPIC    EKG None  Radiology No results found.  Procedures Procedures    Medications Ordered in ED Medications  cefTRIAXone (ROCEPHIN) 2 g in sodium chloride 0.9 % 100 mL IVPB (2 g Intravenous New Bag/Given 06/23/21 1109)  metroNIDAZOLE (FLAGYL) IVPB 500 mg (has no administration in time range)  potassium chloride SA (KLOR-CON M) CR tablet 20 mEq (has no administration in time range)  sodium chloride 0.9 % bolus 1,000 mL (1,000 mLs Intravenous New Bag/Given 06/23/21 1110)    ED Course/ Medical Decision Making/ A&P                           Medical Decision Making Amount and/or Complexity of Data Reviewed Labs: ordered. Radiology: ordered.  Risk Prescription drug management.   60:19 AM  51 year old male with past medical history of hypertension and erectile dysfunction presenting for complaints of abdominal pain.  Patient is alert and oriented x3, no acute distress, afebrile, stable vital signs.  Physical exam demonstrates a well-appearing male, sitting comfortably in bed.  Abdomen is soft with tenderness to palpation in the right lower quadrant.  No guarding or rigidity.  Rectal exam demonstrates no external hemorrhoids, fissures, or masses. No gross blood on exam. Hemoccult sent.   Patient offered medications for pain control and declined at this time.  I independently interpreted patient's labs. Hemoglobin stable. Laboratory studies concerning for leukocytosis with neutrophilia.  No sepsis. Stable liver profile and renal function. Lipase minimally elevated but patient's epigastric region remains soft and nontender. Patient covered prophylactically with intra-abdominal antibiotics including Rocephin and Flagyl.  Recommended for CAT scan.  [facility].  Plans for ER to ER transfer for CT  abdomen and pelvis with IV contrast.  Imaging already ordered.  Patient accepted for ED to ED transfer to [facility] by Dr. Vanita Panda.         Final Clinical Impression(s) / ED Diagnoses Final diagnoses:  Right lower quadrant abdominal pain  Leukocytosis, unspecified type    Rx / DC Orders ED Discharge Orders     None         Lianne Cure, DO 70/01/74 1136

## 2021-06-23 NOTE — ED Notes (Signed)
Pt came in to hall way and said ""take this thing out. I'm leaving" while pointing to his IV. This nurse reminded pt that he had a room assigned and would be going upstairs soon. Pt states he understands the risk of leaving. Pt seen walking out of the department with steady gait, even respirations. PA aware.
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
## 2021-06-23 NOTE — ED Provider Notes (Addendum)
Patient transferred to [facility] emergency department from [facility] emergency department due to CT scan equipment failure.  Patient evaluated previously by attending, Dr. Edwin Dada.  See their note for full assessment.   Briefly: Patient is 51 y.o. male with history of hypertension, erectile dysfunction who presents to the ED with 3 weeks of intermittent right lower quadrant abdominal pain.  Has associated intermittent lightheadedness.  Denies fever, chills, nausea, vomiting.  No previous abdominal surgical history.  Last ate at 8 AM consistent of hashbrowns and half a biscuit.  Denies personal history of malignancy.   Plan: Plan per previous provider: CT abdomen pelvis.  Imaging: CT Abdomen Pelvis: Appendix is not well delineated but appears to be severely edematous, with severe periappendicular inflammatory stranding and abscess. Abscess measures approximately 2.5 x 1.9 x 4.9 cm.   Clinical Course as of 06/23/21 1635  Mon Jun 23, 2021  1449 Discussed with patient CT scan results. Denies history of malignancy.  Patient last ate at 8 AM and was hashbrowns and half a biscuit.  Notes that he felt headed yesterday.  No fever, nausea, vomiting. [SB]  1535 Consult with General Surgery PA-C, Barnetta Chapel who notes that they will evaluate patient in the ED.  [SB]    Clinical Course User Index [SB] Khaidyn Staebell A, PA-C    Concern for appendix perforation and abscess today.  Doubt cholecystitis, cholelithiasis, diverticulitis as cause of the patient's pain today.  After review of labs and CT scan here in the emergency department, feel that patient likely will need admission to the hospital.  Discussed with patient admission plans.  Patient agreeable at this time.  Patient appears safe for admission.   This chart was dictated using voice recognition software, Dragon. Despite the best efforts of this provider to proofread and correct errors, errors may still occur which can change documentation  meaning.   Avel Ogawa A, PA-C 06/23/21 1635    Gerhard Munch, MD 06/23/21 1643  6:25 PM - Patient noted to leave the ED AMA, previously discussed with patient the concerns with leaving AMA, patient acknowledges the risk of potential death in the setting of leaving. Patient continuously states "I feel like I am in a prison in this room." Asked patient if he would like to stay and we can discuss further, patient continues walking out of the Emergency Department.  6:32 PM - Spoke with Dr. Gerrit Friends of General Surgery regarding patient leaving AMA. Dr. Gerrit Friends appreciative and will notify the morning General surgery team.   6:37 PM - Consult with pharmacy regarding antibiotic therapy in the outpatient setting. Pharmacist notes that Augmentin x 2 weeks would be the best option in this case.   6:41 PM - Attempted to call patient to send antibiotics to his pharmacy, however, no answer. Voicemail left. Will send augmentin prescription to patients pharmacy.    Deyna Carbon A, PA-C 06/23/21 2224    Gerhard Munch, MD 06/26/21 [phone]

## 2021-06-23 NOTE — ED Notes (Signed)
Report given to [facility] 

## 2021-06-24 ENCOUNTER — Encounter (HOSPITAL_COMMUNITY): Payer: Self-pay

## 2021-06-24 ENCOUNTER — Inpatient Hospital Stay (HOSPITAL_COMMUNITY)
Admission: EM | Admit: 2021-06-24 | Discharge: 2021-06-30 | DRG: 330 | Disposition: A | Discharge status: Home / Self Care | Payer: BC Managed Care – PPO | Attending: General Surgery | Admitting: General Surgery

## 2021-06-24 ENCOUNTER — Other Ambulatory Visit: Payer: Self-pay

## 2021-06-24 DIAGNOSIS — K3533 Acute appendicitis with perforation and localized peritonitis, with abscess: Secondary | ICD-10-CM | POA: Diagnosis present

## 2021-06-24 DIAGNOSIS — I129 Hypertensive chronic kidney disease with stage 1 through stage 4 chronic kidney disease, or unspecified chronic kidney disease: Secondary | ICD-10-CM | POA: Diagnosis present

## 2021-06-24 DIAGNOSIS — Z8249 Family history of ischemic heart disease and other diseases of the circulatory system: Secondary | ICD-10-CM | POA: Diagnosis not present

## 2021-06-24 DIAGNOSIS — G8929 Other chronic pain: Secondary | ICD-10-CM | POA: Diagnosis present

## 2021-06-24 DIAGNOSIS — K625 Hemorrhage of anus and rectum: Secondary | ICD-10-CM | POA: Diagnosis present

## 2021-06-24 DIAGNOSIS — K567 Ileus, unspecified: Secondary | ICD-10-CM | POA: Diagnosis not present

## 2021-06-24 DIAGNOSIS — F17210 Nicotine dependence, cigarettes, uncomplicated: Secondary | ICD-10-CM | POA: Diagnosis present

## 2021-06-24 DIAGNOSIS — N182 Chronic kidney disease, stage 2 (mild): Secondary | ICD-10-CM | POA: Diagnosis present

## 2021-06-24 DIAGNOSIS — F1010 Alcohol abuse, uncomplicated: Secondary | ICD-10-CM | POA: Diagnosis present

## 2021-06-24 LAB — SURGICAL PCR SCREEN
MRSA, PCR: NEGATIVE
Staphylococcus aureus: NEGATIVE

## 2021-06-24 LAB — LACTIC ACID, PLASMA: Lactic Acid, Venous: 1.7 mmol/L (ref 0.5–1.9)

## 2021-06-24 MED ORDER — ACETAMINOPHEN 500 MG PO TABS
1000.0000 mg | ORAL_TABLET | Freq: Four times a day (QID) | ORAL | Status: DC
  Administered 2021-06-24 – 2021-06-28 (×12): 1000 mg via ORAL
  Administered 2021-06-29: 500 mg via ORAL
  Administered 2021-06-29: 1000 mg via ORAL
  Filled 2021-06-24 (×19): qty 2

## 2021-06-24 MED ORDER — MORPHINE SULFATE (PF) 2 MG/ML IV SOLN
1.0000 mg | INTRAVENOUS | Status: DC | PRN
  Administered 2021-06-25 – 2021-06-30 (×4): 2 mg via INTRAVENOUS
  Filled 2021-06-24 (×2): qty 1
  Filled 2021-06-24: qty 2
  Filled 2021-06-24: qty 1

## 2021-06-24 MED ORDER — PIPERACILLIN-TAZOBACTAM 3.375 G IVPB
3.3750 g | Freq: Three times a day (TID) | INTRAVENOUS | Status: DC
  Administered 2021-06-24 – 2021-06-30 (×16): 3.375 g via INTRAVENOUS
  Filled 2021-06-24 (×16): qty 50

## 2021-06-24 MED ORDER — SODIUM CHLORIDE 0.9 % IV BOLUS
1000.0000 mL | Freq: Once | INTRAVENOUS | Status: AC
  Administered 2021-06-24: 1000 mL via INTRAVENOUS

## 2021-06-24 MED ORDER — DIPHENHYDRAMINE HCL 50 MG/ML IJ SOLN: 25.0000 mg | Freq: Four times a day (QID) | INTRAMUSCULAR | Status: DC | PRN

## 2021-06-24 MED ORDER — DIPHENHYDRAMINE HCL 25 MG PO CAPS: 25.0000 mg | ORAL_CAPSULE | Freq: Four times a day (QID) | ORAL | Status: DC | PRN

## 2021-06-24 MED ORDER — ONDANSETRON 4 MG PO TBDP: 4.0000 mg | ORAL_TABLET | Freq: Four times a day (QID) | ORAL | Status: DC | PRN

## 2021-06-24 MED ORDER — LORAZEPAM 1 MG PO TABS: 1.0000 mg | ORAL_TABLET | ORAL | Status: AC | PRN

## 2021-06-24 MED ORDER — ONDANSETRON HCL 4 MG/2ML IJ SOLN: 4.0000 mg | Freq: Four times a day (QID) | INTRAMUSCULAR | Status: DC | PRN

## 2021-06-24 MED ORDER — FOLIC ACID 1 MG PO TABS
1.0000 mg | ORAL_TABLET | Freq: Every day | ORAL | Status: DC
Start: 2021-06-24 — End: 2021-06-30
  Administered 2021-06-24 – 2021-06-29 (×5): 1 mg via ORAL
  Filled 2021-06-24 (×6): qty 1

## 2021-06-24 MED ORDER — LORAZEPAM 2 MG/ML IJ SOLN: 1.0000 mg | INTRAMUSCULAR | Status: AC | PRN

## 2021-06-24 MED ORDER — KCL IN DEXTROSE-NACL 20-5-0.45 MEQ/L-%-% IV SOLN
INTRAVENOUS | Status: DC
Start: 2021-06-24 — End: 2021-06-30
  Filled 2021-06-24 (×11): qty 1000

## 2021-06-24 MED ORDER — NICOTINE 14 MG/24HR TD PT24
14.0000 mg | MEDICATED_PATCH | Freq: Every day | TRANSDERMAL | Status: DC
  Administered 2021-06-24 – 2021-06-29 (×5): 14 mg via TRANSDERMAL
  Filled 2021-06-24 (×6): qty 1

## 2021-06-24 MED ORDER — THIAMINE HCL 100 MG/ML IJ SOLN: 100.0000 mg | Freq: Every day | INTRAMUSCULAR | Status: DC

## 2021-06-24 MED ORDER — ENSURE ENLIVE PO LIQD: 237.0000 mL | Freq: Two times a day (BID) | ORAL | Status: DC

## 2021-06-24 MED ORDER — HYDRALAZINE HCL 20 MG/ML IJ SOLN
10.0000 mg | INTRAMUSCULAR | Status: DC | PRN
  Filled 2021-06-24: qty 1

## 2021-06-24 MED ORDER — ENOXAPARIN SODIUM 40 MG/0.4ML IJ SOSY
40.0000 mg | PREFILLED_SYRINGE | INTRAMUSCULAR | Status: DC
  Administered 2021-06-24: 40 mg via SUBCUTANEOUS
  Filled 2021-06-24: qty 0.4

## 2021-06-24 MED ORDER — THIAMINE HCL 100 MG PO TABS
100.0000 mg | ORAL_TABLET | Freq: Every day | ORAL | Status: DC
  Administered 2021-06-24 – 2021-06-29 (×5): 100 mg via ORAL
  Filled 2021-06-24 (×6): qty 1

## 2021-06-24 MED ORDER — MELATONIN 3 MG PO TABS
3.0000 mg | ORAL_TABLET | Freq: Every evening | ORAL | Status: DC | PRN
  Administered 2021-06-27: 3 mg via ORAL
  Filled 2021-06-24: qty 1

## 2021-06-24 MED ORDER — ADULT MULTIVITAMIN W/MINERALS CH
1.0000 | ORAL_TABLET | Freq: Every day | ORAL | Status: DC
  Administered 2021-06-24 – 2021-06-29 (×5): 1 via ORAL
  Filled 2021-06-24 (×6): qty 1

## 2021-06-24 MED ORDER — SIMETHICONE 80 MG PO CHEW
40.0000 mg | CHEWABLE_TABLET | Freq: Four times a day (QID) | ORAL | Status: DC | PRN
  Administered 2021-06-26: 40 mg via ORAL
  Filled 2021-06-24 (×2): qty 1

## 2021-06-24 MED ORDER — PIPERACILLIN-TAZOBACTAM 3.375 G IVPB 30 MIN
3.3750 g | Freq: Once | INTRAVENOUS | Status: AC
  Administered 2021-06-24: 3.375 g via INTRAVENOUS
  Filled 2021-06-24: qty 50

## 2021-06-24 NOTE — ED Provider Notes (Signed)
[facility]-EMERGENCY DEPT Provider Note   CSN: [id number] Arrival date & time: 06/24/21  1206     History  Chief Complaint  Patient presents with   Abdominal Pain    Anthony Randolph is a 51 y.o. male.  Patient c/o lower abdominal pain  for the past 2-3 weeks. Symptoms acute onset, moderate, constant, persistent. Was in ED yesterday, and workup c/w appendicitis w contained perf/abscess. General surgery was going to admit but patient left AMA. Pt now returns indicating he does want treatment. No fever or chills. +decreased appetite. No dysuria or hematuria. No back or flank pain.   The history is provided by the patient and medical records.  Abdominal Pain Associated symptoms: nausea   Associated symptoms: no chest pain, no dysuria, no fever, no shortness of breath and no sore throat        Home Medications Prior to Admission medications   Medication Sig Start Date End Date Taking? Authorizing Provider  acetaminophen (TYLENOL) 500 MG tablet Take 1,000 mg by mouth every 6 (six) hours as needed for mild pain.   Yes [provider]  cyclobenzaprine (FLEXERIL) 10 MG tablet Take 1 tablet (10 mg total) by mouth 3 (three) times daily as needed. for muscle spams Patient taking differently: Take 10 mg by mouth 3 (three) times daily as needed for muscle spasms. 05/06/21  Yes Chambliss, Estill Batten, MD  indapamide (LOZOL) 2.5 MG tablet Take 1 tablet (2.5 mg total) by mouth daily. 05/30/21  Yes Sowell, Apolinar Junes, MD  losartan (COZAAR) 100 MG tablet Take 1 tablet (100 mg total) by mouth daily. 05/30/21  Yes Sowell, Apolinar Junes, MD  sildenafil (VIAGRA) 100 MG tablet TAKE 1/2 TO 1 (ONE-HALF TO ONE) TABLET BY MOUTH ONCE DAILY AS NEEDED FOR  ERECTILE  DYSFUNCTION 05/30/21  Yes Sowell, Apolinar Junes, MD  triamcinolone ointment (KENALOG) 0.5 % Apply 1 application. topically 2 (two) times daily. 05/06/21  Yes Carney Living, MD  amoxicillin-clavulanate (AUGMENTIN) 875-125 MG tablet  Take 1 tablet by mouth every 12 (twelve) hours. Patient not taking: Reported on 06/24/2021 06/23/21   Blue, Soijett A, PA-C  cloNIDine (CATAPRES - DOSED IN MG/24 HR) 0.1 mg/24hr patch Place 1 patch (0.1 mg total) onto the skin once a week. Patient not taking: Reported on 06/23/2021 05/30/21   Bess Kinds, MD  diclofenac Sodium (VOLTAREN) 1 % GEL Apply 2 g topically 4 (four) times daily. Patient not taking: Reported on 06/23/2021 05/30/21   Bess Kinds, MD  rosuvastatin (CRESTOR) 20 MG tablet Take 1 tablet (20 mg total) by mouth daily. Patient not taking: Reported on 06/23/2021 05/30/21   Bess Kinds, MD      Allergies    Patient has no known allergies.    Review of Systems   Review of Systems  Constitutional:  Positive for appetite change. Negative for fever.  HENT:  Negative for sore throat.   Eyes:  Negative for redness.  Respiratory:  Negative for shortness of breath.   Cardiovascular:  Negative for chest pain.  Gastrointestinal:  Positive for abdominal pain and nausea.  Genitourinary:  Negative for dysuria and flank pain.  Musculoskeletal:  Negative for back pain and neck pain.  Skin:  Negative for rash.  Neurological:  Negative for headaches.  Hematological:  Does not bruise/bleed easily.  Psychiatric/Behavioral:  Negative for confusion.     Physical Exam Updated Vital Signs BP 97/65   Pulse 96   Temp 98.7 F (37.1 C) (Oral)   Resp 18   SpO2  98%  Physical Exam Vitals and nursing note reviewed.  Constitutional:      Appearance: Normal appearance. He is well-developed.  HENT:     Head: Atraumatic.     Nose: Nose normal.     Mouth/Throat:     Mouth: Mucous membranes are moist.     Pharynx: Oropharynx is clear.  Eyes:     General: No scleral icterus.    Conjunctiva/sclera: Conjunctivae normal.  Neck:     Trachea: No tracheal deviation.  Cardiovascular:     Rate and Rhythm: Normal rate and regular rhythm.     Pulses: Normal pulses.     Heart sounds: Normal  heart sounds. No murmur heard.    No friction rub. No gallop.  Pulmonary:     Effort: Pulmonary effort is normal. No accessory muscle usage or respiratory distress.     Breath sounds: Normal breath sounds.  Abdominal:     General: Bowel sounds are normal. There is no distension.     Palpations: Abdomen is soft.     Tenderness: There is abdominal tenderness. There is no guarding.     Comments: Right mid to lower abd tenderness.   Genitourinary:    Comments: No cva tenderness. Musculoskeletal:        General: No swelling.     Cervical back: Normal range of motion and neck supple. No rigidity.  Skin:    General: Skin is warm and dry.     Findings: No rash.  Neurological:     Mental Status: He is alert.     Comments: Alert, speech clear.   Psychiatric:        Mood and Affect: Mood normal.     ED Results / Procedures / Treatments   Labs (all labs ordered are listed, but only abnormal results are displayed) Results for orders placed or performed during the hospital encounter of 06/24/21  Lactic acid, plasma  Result Value Ref Range   Lactic Acid, Venous 1.7 0.5 - 1.9 mmol/L   CT ABDOMEN PELVIS W CONTRAST  Result Date: 06/23/2021 CLINICAL DATA:  RLQ abdominal pain (Age >= 14y) EXAM: CT ABDOMEN AND PELVIS WITH CONTRAST TECHNIQUE: Multidetector CT imaging of the abdomen and pelvis was performed using the standard protocol following bolus administration of intravenous contrast. RADIATION DOSE REDUCTION: This exam was performed according to the departmental dose-optimization program which includes automated exposure control, adjustment of the mA and/or kV according to patient size and/or use of iterative reconstruction technique. CONTRAST:  15mL OMNIPAQUE IOHEXOL 300 MG/ML  SOLN COMPARISON:  None Available. FINDINGS: Lower chest: No acute abnormality.  Small hiatal hernia. Hepatobiliary: No focal liver abnormality is seen except for focal fatty infiltration in the anterior left lobe of the  liver adjacent to the falciform ligament. No gallstones, gallbladder wall thickening, or biliary dilatation. Pancreas: Unremarkable. No pancreatic ductal dilatation or surrounding inflammatory changes. Spleen: Normal in size without focal abnormality. Adrenals/Urinary Tract: Adrenal glands are unremarkable. Kidneys are normal, without renal calculi, focal lesion, or hydronephrosis. Bladder is unremarkable. Apparent thickening of the urinary bladder wall. Stomach/Bowel: Appendix is not well delineated but appears to be severely thickened with severe inflammatory changes at the appendicular region with an enhancing abscess measuring 2.5 cm transverse x 1.9 cm AP x 4.9 cm craniocaudad Vascular/Lymphatic: No significant vascular findings are present except for moderate atheromatous calcifications in the infrarenal abdominal aorta. No enlarged abdominal or pelvic lymph nodes. Reproductive: Prostate is unremarkable. Other: No abdominal wall hernia or abnormality. No abdominopelvic ascites.  Musculoskeletal: Mild thoracolumbar spondylosis with prominent marginal osteophytes. IMPRESSION: Appendix is not well delineated but appears to be severely edematous, with severe periappendicular inflammatory stranding and abscess. Abscess measures approximately 2.5 x 1.9 x 4.9 cm and may be amenable for CT-guided drain insertion. Electronically Signed   By: Frazier Richards M.D.   On: 06/23/2021 14:02      EKG None  Radiology CT ABDOMEN PELVIS W CONTRAST  Result Date: 06/23/2021 CLINICAL DATA:  RLQ abdominal pain (Age >= 14y) EXAM: CT ABDOMEN AND PELVIS WITH CONTRAST TECHNIQUE: Multidetector CT imaging of the abdomen and pelvis was performed using the standard protocol following bolus administration of intravenous contrast. RADIATION DOSE REDUCTION: This exam was performed according to the departmental dose-optimization program which includes automated exposure control, adjustment of the mA and/or kV according to patient size  and/or use of iterative reconstruction technique. CONTRAST:  86mL OMNIPAQUE IOHEXOL 300 MG/ML  SOLN COMPARISON:  None Available. FINDINGS: Lower chest: No acute abnormality.  Small hiatal hernia. Hepatobiliary: No focal liver abnormality is seen except for focal fatty infiltration in the anterior left lobe of the liver adjacent to the falciform ligament. No gallstones, gallbladder wall thickening, or biliary dilatation. Pancreas: Unremarkable. No pancreatic ductal dilatation or surrounding inflammatory changes. Spleen: Normal in size without focal abnormality. Adrenals/Urinary Tract: Adrenal glands are unremarkable. Kidneys are normal, without renal calculi, focal lesion, or hydronephrosis. Bladder is unremarkable. Apparent thickening of the urinary bladder wall. Stomach/Bowel: Appendix is not well delineated but appears to be severely thickened with severe inflammatory changes at the appendicular region with an enhancing abscess measuring 2.5 cm transverse x 1.9 cm AP x 4.9 cm craniocaudad Vascular/Lymphatic: No significant vascular findings are present except for moderate atheromatous calcifications in the infrarenal abdominal aorta. No enlarged abdominal or pelvic lymph nodes. Reproductive: Prostate is unremarkable. Other: No abdominal wall hernia or abnormality. No abdominopelvic ascites. Musculoskeletal: Mild thoracolumbar spondylosis with prominent marginal osteophytes. IMPRESSION: Appendix is not well delineated but appears to be severely edematous, with severe periappendicular inflammatory stranding and abscess. Abscess measures approximately 2.5 x 1.9 x 4.9 cm and may be amenable for CT-guided drain insertion. Electronically Signed   By: Frazier Richards M.D.   On: 06/23/2021 14:02    Procedures Procedures    Medications Ordered in ED Medications  piperacillin-tazobactam (ZOSYN) IVPB 3.375 g (3.375 g Intravenous New Bag/Given 06/24/21 1516)  LORazepam (ATIVAN) tablet 1-4 mg (has no administration in  time range)    Or  LORazepam (ATIVAN) injection 1-4 mg (has no administration in time range)  thiamine tablet 100 mg (100 mg Oral Given 06/24/21 1458)    Or  thiamine (B-1) injection 100 mg ( Intravenous See Alternative [id number])  folic acid (FOLVITE) tablet 1 mg (1 mg Oral Given 06/24/21 1458)  multivitamin with minerals tablet 1 tablet (1 tablet Oral Given 06/24/21 1458)  enoxaparin (LOVENOX) injection 40 mg (has no administration in time range)  dextrose 5 % and 0.45 % NaCl with KCl 20 mEq/L infusion (has no administration in time range)  piperacillin-tazobactam (ZOSYN) IVPB 3.375 g (has no administration in time range)  acetaminophen (TYLENOL) tablet 1,000 mg (1,000 mg Oral Given 06/24/21 1458)  morphine (PF) 2 MG/ML injection 1-4 mg (has no administration in time range)  melatonin tablet 3 mg (has no administration in time range)  diphenhydrAMINE (BENADRYL) capsule 25 mg (has no administration in time range)    Or  diphenhydrAMINE (BENADRYL) injection 25 mg (has no administration in time range)  ondansetron (ZOFRAN-ODT) disintegrating tablet  4 mg (has no administration in time range)    Or  ondansetron (ZOFRAN) injection 4 mg (has no administration in time range)  simethicone (MYLICON) chewable tablet 40 mg (has no administration in time range)  nicotine (NICODERM CQ - dosed in mg/24 hours) patch 14 mg (14 mg Transdermal Patch Applied 06/24/21 1516)  hydrALAZINE (APRESOLINE) injection 10 mg (has no administration in time range)  sodium chloride 0.9 % bolus 1,000 mL (1,000 mLs Intravenous New Bag/Given 06/24/21 1455)    ED Course/ Medical Decision Making/ A&P                           Medical Decision Making Problems Addressed: Acute appendicitis with perforation, localized peritonitis, and abscess, without gangrene: acute illness or injury with systemic symptoms that poses a threat to life or bodily functions  Amount and/or Complexity of Data Reviewed External Data Reviewed:  radiology and notes. Labs: ordered. Decision-making details documented in ED Course. Radiology:  Decision-making details documented in ED Course.  Risk Prescription drug management. Decision regarding hospitalization.   Iv ns. Continuous pulse ox and cardiac monitoring. Labs ordered/sent.   Reviewed nursing notes and prior charts for additional history. External reports reviewed.   Cardiac monitor: sinus rhythm, rate  Labs reviewed/interpreted by me - lactate normal.   Recent CT reviewed/interpreted by me - +appendicitis w abscess.  Zosyn iv. Ns bolus.   General surgery consulted - discussed pt - they will see/admit.           Final Clinical Impression(s) / ED Diagnoses Final diagnoses:  None    Rx / DC Orders ED Discharge Orders     None         Lajean Saver, MD 06/24/21 [phone]

## 2021-06-24 NOTE — ED Triage Notes (Signed)
Pt reports increased abdominal pain and rectal bleeding since yesterday. Pt reports he was admitted to have surgery yesterday, but left due to feeling like he was in a prison because he could not walk outside and drink water when he wanted to.
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
## 2021-06-25 ENCOUNTER — Other Ambulatory Visit: Payer: Self-pay

## 2021-06-25 ENCOUNTER — Encounter (HOSPITAL_COMMUNITY): Admission: EM | Disposition: A | Discharge status: Home / Self Care | Payer: Self-pay | Source: Home / Self Care

## 2021-06-25 ENCOUNTER — Inpatient Hospital Stay (HOSPITAL_COMMUNITY): Payer: BC Managed Care – PPO | Admitting: Anesthesiology

## 2021-06-25 ENCOUNTER — Encounter (HOSPITAL_COMMUNITY): Payer: Self-pay

## 2021-06-25 ENCOUNTER — Inpatient Hospital Stay (HOSPITAL_COMMUNITY): Payer: BC Managed Care – PPO

## 2021-06-25 HISTORY — PX: LAPAROTOMY: SHX154

## 2021-06-25 LAB — BASIC METABOLIC PANEL
Anion gap: 10 (ref 5–15)
BUN: 17 mg/dL (ref 6–20)
CO2: 26 mmol/L (ref 22–32)
Calcium: 9.1 mg/dL (ref 8.9–10.3)
Chloride: 105 mmol/L (ref 98–111)
Creatinine, Ser: 1.75 mg/dL — ABNORMAL HIGH (ref 0.61–1.24)
GFR, Estimated: 47 mL/min — ABNORMAL LOW (ref >60)
Glucose, Bld: 115 mg/dL — ABNORMAL HIGH (ref 70–99)
Potassium: 4 mmol/L (ref 3.5–5.1)
Sodium: 141 mmol/L (ref 135–145)

## 2021-06-25 LAB — CBC
HCT: 45.2 % (ref 39.0–52.0)
Hemoglobin: 14.2 g/dL (ref 13.0–17.0)
MCH: 33.4 pg (ref 26.0–34.0)
MCHC: 31.4 g/dL (ref 30.0–36.0)
MCV: 106.4 fL — ABNORMAL HIGH (ref 80.0–100.0)
Platelets: 500 10*3/uL — ABNORMAL HIGH (ref 150–400)
RBC: 4.25 MIL/uL (ref 4.22–5.81)
RDW: 12.9 % (ref 11.5–15.5)
WBC: 11.6 10*3/uL — ABNORMAL HIGH (ref 4.0–10.5)
nRBC: 0 % (ref 0.0–0.2)

## 2021-06-25 LAB — CEA: CEA: 2.2 ng/mL (ref 0.0–4.7)

## 2021-06-25 LAB — TYPE AND SCREEN
ABO/RH(D): B POS
Antibody Screen: NEGATIVE

## 2021-06-25 LAB — SURGICAL PCR SCREEN
MRSA, PCR: NEGATIVE
Staphylococcus aureus: NEGATIVE

## 2021-06-25 LAB — ABO/RH: ABO/RH(D): B POS

## 2021-06-25 SURGERY — LAPAROTOMY, EXPLORATORY
Anesthesia: General | Laterality: Right

## 2021-06-25 MED ORDER — KETOROLAC TROMETHAMINE 15 MG/ML IJ SOLN
INTRAMUSCULAR | Status: AC
  Filled 2021-06-25: qty 1

## 2021-06-25 MED ORDER — 0.9 % SODIUM CHLORIDE (POUR BTL) OPTIME
TOPICAL | Status: DC | PRN
  Administered 2021-06-25: 2000 mL

## 2021-06-25 MED ORDER — ENOXAPARIN SODIUM 40 MG/0.4ML IJ SOSY
40.0000 mg | PREFILLED_SYRINGE | INTRAMUSCULAR | Status: DC
  Administered 2021-06-26 – 2021-06-30 (×5): 40 mg via SUBCUTANEOUS
  Filled 2021-06-25 (×5): qty 0.4

## 2021-06-25 MED ORDER — HYDRALAZINE HCL 20 MG/ML IJ SOLN
5.0000 mg | INTRAMUSCULAR | Status: DC | PRN
  Administered 2021-06-25: 5 mg via INTRAVENOUS

## 2021-06-25 MED ORDER — FENTANYL CITRATE (PF) 250 MCG/5ML IJ SOLN
INTRAMUSCULAR | Status: DC | PRN
  Administered 2021-06-25: 50 ug via INTRAVENOUS
  Administered 2021-06-25 (×2): 100 ug via INTRAVENOUS

## 2021-06-25 MED ORDER — SUGAMMADEX SODIUM 500 MG/5ML IV SOLN
INTRAVENOUS | Status: AC
  Filled 2021-06-25: qty 5

## 2021-06-25 MED ORDER — MIDAZOLAM HCL 5 MG/5ML IJ SOLN
INTRAMUSCULAR | Status: DC | PRN
  Administered 2021-06-25: 2 mg via INTRAVENOUS

## 2021-06-25 MED ORDER — ROCURONIUM BROMIDE 10 MG/ML (PF) SYRINGE
PREFILLED_SYRINGE | INTRAVENOUS | Status: AC
Start: 2021-06-25 — End: ?
  Filled 2021-06-25: qty 10

## 2021-06-25 MED ORDER — LIDOCAINE HCL (CARDIAC) PF 100 MG/5ML IV SOSY
PREFILLED_SYRINGE | INTRAVENOUS | Status: DC | PRN
  Administered 2021-06-25: 60 mg via INTRATRACHEAL

## 2021-06-25 MED ORDER — DEXAMETHASONE SODIUM PHOSPHATE 10 MG/ML IJ SOLN
INTRAMUSCULAR | Status: DC | PRN
  Administered 2021-06-25: 4 mg via INTRAVENOUS

## 2021-06-25 MED ORDER — CHLORHEXIDINE GLUCONATE CLOTH 2 % EX PADS
6.0000 | MEDICATED_PAD | Freq: Every day | CUTANEOUS | Status: DC
  Administered 2021-06-25 – 2021-06-26 (×2): 6 via TOPICAL

## 2021-06-25 MED ORDER — HYDRALAZINE HCL 20 MG/ML IJ SOLN
INTRAMUSCULAR | Status: AC
  Filled 2021-06-25: qty 1

## 2021-06-25 MED ORDER — HYDROMORPHONE HCL 1 MG/ML IJ SOLN
INTRAMUSCULAR | Status: DC | PRN
  Administered 2021-06-25: 2 mg via INTRAVENOUS

## 2021-06-25 MED ORDER — PROPOFOL 10 MG/ML IV BOLUS
INTRAVENOUS | Status: AC
  Filled 2021-06-25: qty 20

## 2021-06-25 MED ORDER — HYDROMORPHONE HCL 1 MG/ML IJ SOLN
0.2500 mg | INTRAMUSCULAR | Status: DC | PRN
  Administered 2021-06-25 (×2): 0.5 mg via INTRAVENOUS

## 2021-06-25 MED ORDER — HYDROMORPHONE HCL 1 MG/ML IJ SOLN
0.2500 mg | INTRAMUSCULAR | Status: DC | PRN
  Administered 2021-06-25: 0.5 mg via INTRAVENOUS

## 2021-06-25 MED ORDER — OXYCODONE HCL 5 MG PO TABS
5.0000 mg | ORAL_TABLET | ORAL | Status: DC | PRN
  Administered 2021-06-25: 10 mg via ORAL
  Administered 2021-06-25: 5 mg via ORAL
  Administered 2021-06-26 – 2021-06-27 (×7): 10 mg via ORAL
  Filled 2021-06-25: qty 2
  Filled 2021-06-25: qty 1
  Filled 2021-06-25 (×8): qty 2

## 2021-06-25 MED ORDER — CEFAZOLIN SODIUM-DEXTROSE 2-3 GM-%(50ML) IV SOLR
INTRAVENOUS | Status: DC | PRN
  Administered 2021-06-25: 2 g via INTRAVENOUS

## 2021-06-25 MED ORDER — DEXMEDETOMIDINE (PRECEDEX) IN NS 20 MCG/5ML (4 MCG/ML) IV SYRINGE
PREFILLED_SYRINGE | INTRAVENOUS | Status: DC | PRN
  Administered 2021-06-25: 12 ug via INTRAVENOUS
  Administered 2021-06-25: 8 ug via INTRAVENOUS

## 2021-06-25 MED ORDER — ACETAMINOPHEN 10 MG/ML IV SOLN
1000.0000 mg | Freq: Once | INTRAVENOUS | Status: DC | PRN
Start: 2021-06-25 — End: 2021-06-25
  Administered 2021-06-25: 1000 mg via INTRAVENOUS

## 2021-06-25 MED ORDER — SUGAMMADEX SODIUM 500 MG/5ML IV SOLN
INTRAVENOUS | Status: DC | PRN
  Administered 2021-06-25: 500 mg via INTRAVENOUS

## 2021-06-25 MED ORDER — FENTANYL CITRATE (PF) 250 MCG/5ML IJ SOLN
INTRAMUSCULAR | Status: AC
  Filled 2021-06-25: qty 5

## 2021-06-25 MED ORDER — HYDROMORPHONE HCL 1 MG/ML IJ SOLN
INTRAMUSCULAR | Status: AC
  Administered 2021-06-25: 0.5 mg via INTRAVENOUS
  Filled 2021-06-25: qty 1

## 2021-06-25 MED ORDER — FENTANYL CITRATE PF 50 MCG/ML IJ SOSY
25.0000 ug | PREFILLED_SYRINGE | INTRAMUSCULAR | Status: DC | PRN
  Administered 2021-06-25 (×2): 50 ug via INTRAVENOUS

## 2021-06-25 MED ORDER — FENTANYL CITRATE PF 50 MCG/ML IJ SOSY
PREFILLED_SYRINGE | INTRAMUSCULAR | Status: AC
  Administered 2021-06-25: 50 ug via INTRAVENOUS
  Filled 2021-06-25: qty 3

## 2021-06-25 MED ORDER — LIDOCAINE HCL (PF) 2 % IJ SOLN
INTRAMUSCULAR | Status: AC
  Filled 2021-06-25: qty 5

## 2021-06-25 MED ORDER — ROCURONIUM BROMIDE 100 MG/10ML IV SOLN
INTRAVENOUS | Status: DC | PRN
  Administered 2021-06-25: 60 mg via INTRAVENOUS
  Administered 2021-06-25: 40 mg via INTRAVENOUS

## 2021-06-25 MED ORDER — OXYCODONE HCL 5 MG/5ML PO SOLN: 5.0000 mg | Freq: Once | ORAL | Status: DC | PRN

## 2021-06-25 MED ORDER — ONDANSETRON HCL 4 MG/2ML IJ SOLN
INTRAMUSCULAR | Status: DC | PRN
  Administered 2021-06-25: 4 mg via INTRAVENOUS

## 2021-06-25 MED ORDER — MIDAZOLAM HCL 2 MG/2ML IJ SOLN
INTRAMUSCULAR | Status: AC
Start: 2021-06-25 — End: ?
  Filled 2021-06-25: qty 2

## 2021-06-25 MED ORDER — KETOROLAC TROMETHAMINE 30 MG/ML IJ SOLN
15.0000 mg | Freq: Once | INTRAMUSCULAR | Status: AC
  Administered 2021-06-25: 15 mg via INTRAVENOUS

## 2021-06-25 MED ORDER — ONDANSETRON HCL 4 MG/2ML IJ SOLN
INTRAMUSCULAR | Status: AC
  Filled 2021-06-25: qty 2

## 2021-06-25 MED ORDER — OXYCODONE HCL 5 MG PO TABS: 5.0000 mg | ORAL_TABLET | Freq: Once | ORAL | Status: DC | PRN

## 2021-06-25 MED ORDER — ACETAMINOPHEN 10 MG/ML IV SOLN
INTRAVENOUS | Status: AC
  Filled 2021-06-25: qty 100

## 2021-06-25 MED ORDER — PROPOFOL 10 MG/ML IV BOLUS
INTRAVENOUS | Status: DC | PRN
  Administered 2021-06-25: 200 mg via INTRAVENOUS

## 2021-06-25 MED ORDER — METHOCARBAMOL 500 MG PO TABS
500.0000 mg | ORAL_TABLET | Freq: Three times a day (TID) | ORAL | Status: DC
  Administered 2021-06-25 – 2021-06-29 (×13): 500 mg via ORAL
  Filled 2021-06-25 (×15): qty 1

## 2021-06-25 MED ORDER — HYDROMORPHONE HCL 2 MG/ML IJ SOLN
INTRAMUSCULAR | Status: AC
  Filled 2021-06-25: qty 1

## 2021-06-25 MED ORDER — AMISULPRIDE (ANTIEMETIC) 5 MG/2ML IV SOLN: 10.0000 mg | Freq: Once | INTRAVENOUS | Status: DC | PRN

## 2021-06-25 MED ORDER — LACTATED RINGERS IV SOLN: INTRAVENOUS | Status: DC

## 2021-06-25 MED ORDER — CHLORHEXIDINE GLUCONATE 0.12 % MT SOLN
15.0000 mL | Freq: Once | OROMUCOSAL | Status: AC
Start: 2021-06-25 — End: 2021-06-25
  Administered 2021-06-25: 15 mL via OROMUCOSAL

## 2021-06-25 MED ORDER — DEXAMETHASONE SODIUM PHOSPHATE 10 MG/ML IJ SOLN
INTRAMUSCULAR | Status: AC
Start: 2021-06-25 — End: ?
  Filled 2021-06-25: qty 1

## 2021-06-25 SURGICAL SUPPLY — 45 items
APL PRP STRL LF DISP 70% ISPRP (MISCELLANEOUS) ×1
BAG COUNTER SPONGE SURGICOUNT (BAG) IMPLANT
BAG SPNG CNTER NS LX DISP (BAG)
BLADE EXTENDED COATED 6.5IN (ELECTRODE) IMPLANT
CHLORAPREP W/TINT 26 (MISCELLANEOUS) ×2 IMPLANT
COVER MAYO STAND STRL (DRAPES) ×6 IMPLANT
COVER SURGICAL LIGHT HANDLE (MISCELLANEOUS) ×2 IMPLANT
DRAPE LAPAROSCOPIC ABDOMINAL (DRAPES) ×2 IMPLANT
DRSG OPSITE POSTOP 4X10 (GAUZE/BANDAGES/DRESSINGS) IMPLANT
DRSG OPSITE POSTOP 4X6 (GAUZE/BANDAGES/DRESSINGS) IMPLANT
DRSG OPSITE POSTOP 4X8 (GAUZE/BANDAGES/DRESSINGS) ×1 IMPLANT
ELECT REM PT RETURN 15FT ADLT (MISCELLANEOUS) ×2 IMPLANT
GAUZE SPONGE 4X4 12PLY STRL (GAUZE/BANDAGES/DRESSINGS) IMPLANT
GLOVE BIO SURGEON STRL SZ7 (GLOVE) ×4 IMPLANT
GLOVE BIOGEL PI IND STRL 7.5 (GLOVE) ×2 IMPLANT
GLOVE BIOGEL PI INDICATOR 7.5 (GLOVE) ×2
GOWN STRL REUS W/ TWL LRG LVL3 (GOWN DISPOSABLE) ×2 IMPLANT
GOWN STRL REUS W/TWL LRG LVL3 (GOWN DISPOSABLE) ×4
HANDLE SUCTION POOLE (INSTRUMENTS) IMPLANT
KIT TURNOVER KIT A (KITS) IMPLANT
LIGASURE IMPACT 36 18CM CVD LR (INSTRUMENTS) ×1 IMPLANT
PACK COLON (CUSTOM PROCEDURE TRAY) ×2 IMPLANT
PENCIL SMOKE EVACUATOR (MISCELLANEOUS) IMPLANT
RELOAD PROXIMATE 75MM BLUE (ENDOMECHANICALS) ×10 IMPLANT
RELOAD PROXIMATE TA60MM BLUE (ENDOMECHANICALS) ×2 IMPLANT
RELOAD STAPLE 60 BLU REG PROX (ENDOMECHANICALS) IMPLANT
RELOAD STAPLE 75 3.8 BLU REG (ENDOMECHANICALS) IMPLANT
STAPLER GUN LINEAR PROX 60 (STAPLE) ×1 IMPLANT
STAPLER PROXIMAT 75MM THCK GRN (MISCELLANEOUS) ×1 IMPLANT
STAPLER PROXIMATE 75MM BLUE (STAPLE) ×1 IMPLANT
STAPLER VISISTAT 35W (STAPLE) ×2 IMPLANT
SUCTION POOLE HANDLE (INSTRUMENTS)
SUT NOVA NAB GS-21 0 18 T12 DT (SUTURE) IMPLANT
SUT NOVA NAB GS-21 1 T12 (SUTURE) IMPLANT
SUT PDS AB 1 TP1 96 (SUTURE) ×2 IMPLANT
SUT SILK 2 0 (SUTURE) ×2
SUT SILK 2 0 SH CR/8 (SUTURE) ×2 IMPLANT
SUT SILK 2-0 18XBRD TIE 12 (SUTURE) ×1 IMPLANT
SUT SILK 3 0 (SUTURE) ×2
SUT SILK 3 0 SH CR/8 (SUTURE) ×2 IMPLANT
SUT SILK 3-0 18XBRD TIE 12 (SUTURE) ×1 IMPLANT
TOWEL OR 17X26 10 PK STRL BLUE (TOWEL DISPOSABLE) IMPLANT
TOWEL OR NON WOVEN STRL DISP B (DISPOSABLE) ×2 IMPLANT
TRAY FOLEY MTR SLVR 14FR STAT (SET/KITS/TRAYS/PACK) IMPLANT
TRAY FOLEY MTR SLVR 16FR STAT (SET/KITS/TRAYS/PACK) IMPLANT

## 2021-06-25 NOTE — Anesthesia Procedure Notes (Signed)
Procedure Name: Intubation Date/Time: 06/25/2021 10:27 AM  Performed by: Jonna Munro, CRNAPre-anesthesia Checklist: Patient identified, Emergency Drugs available, Suction available, Patient being monitored and Timeout performed Patient Re-evaluated:Patient Re-evaluated prior to induction Oxygen Delivery Method: Circle system utilized Preoxygenation: Pre-oxygenation with 100% oxygen Induction Type: IV induction Ventilation: Mask ventilation without difficulty Laryngoscope Size: Mac and 3 Grade View: Grade II Tube type: Oral Tube size: 7.5 mm Number of attempts: 1 Airway Equipment and Method: Stylet Placement Confirmation: ETT inserted through vocal cords under direct vision, positive ETCO2, CO2 detector and breath sounds checked- equal and bilateral Secured at: 23 cm Tube secured with: Tape Dental Injury: Teeth and Oropharynx as per pre-operative assessment

## 2021-06-25 NOTE — Brief Op Note (Signed)
06/25/2021  12:10 PM  PATIENT:  Anthony Randolph  51 y.o. male  PRE-OPERATIVE DIAGNOSIS:  mass  POST-OPERATIVE DIAGNOSIS:  mass  PROCEDURE:  Procedure(s): EXPLORATORY LAPAROTOMY,  RIGHT COLECTOMY (Right)  SURGEON:  Surgeon(s) and Role:    Emelia Loron, MD - Primary  PHYSICIAN ASSISTANT:   ASSISTANTS: Leary Roca, PA-D   ANESTHESIA:   general  EBL:  50 mL   BLOOD ADMINISTERED:none  DRAINS: none   LOCAL MEDICATIONS USED:  NONE  SPECIMEN:  Excision  DISPOSITION OF SPECIMEN:  PATHOLOGY  COUNTS:  YES  TOURNIQUET:  * No tourniquets in log *  DICTATION: .Dragon Dictation  PLAN OF CARE: Admit to inpatient   PATIENT DISPOSITION:  PACU - hemodynamically stable.   Delay start of Pharmacological VTE agent (>24hrs) due to surgical blood loss or risk of bleeding: no

## 2021-06-25 NOTE — Anesthesia Preprocedure Evaluation (Addendum)
Anesthesia Evaluation  Patient identified by MRN, date of birth, ID band Patient awake    Reviewed: Allergy & Precautions, NPO status , Patient's Chart, lab work & pertinent test results  Airway Mallampati: III  TM Distance: >3 FB Neck ROM: Full    Dental  (+) Poor Dentition, Chipped, Loose,    Pulmonary Current Smoker and Patient abstained from smoking.,    Pulmonary exam normal        Cardiovascular hypertension, Pt. on medications Normal cardiovascular exam     Neuro/Psych negative neurological ROS  negative psych ROS   GI/Hepatic negative GI ROS, (+)     substance abuse  ,   Endo/Other  negative endocrine ROS  Renal/GU Renal InsufficiencyRenal disease     Musculoskeletal negative musculoskeletal ROS (+)   Abdominal   Peds  Hematology negative hematology ROS (+)   Anesthesia Other Findings mass  Reproductive/Obstetrics                            Anesthesia Physical Anesthesia Plan  ASA: 2  Anesthesia Plan: General   Post-op Pain Management:    Induction: Intravenous  PONV Risk Score and Plan: 2 and Ondansetron, Dexamethasone, Midazolam and Treatment may vary due to age or medical condition  Airway Management Planned: Oral ETT  Additional Equipment:   Intra-op Plan:   Post-operative Plan: Extubation in OR  Informed Consent: I have reviewed the patients History and Physical, chart, labs and discussed the procedure including the risks, benefits and alternatives for the proposed anesthesia with the patient or authorized representative who has indicated his/her understanding and acceptance.     Dental advisory given  Plan Discussed with: CRNA  Anesthesia Plan Comments:        Anesthesia Quick Evaluation

## 2021-06-25 NOTE — Progress Notes (Signed)
Initial Nutrition Assessment  INTERVENTION:   -Ensure Plus High Protein po BID, each supplement provides 350 kcal and 20 grams of protein.   NUTRITION DIAGNOSIS:   Increased nutrient needs related to post-op healing as evidenced by estimated needs.  GOAL:   Patient will meet greater than or equal to 90% of their needs  MONITOR:   PO intake, Supplement acceptance, Labs, Weight trends, I & O's  REASON FOR ASSESSMENT:   Malnutrition Screening Tool    ASSESSMENT:   51 yo male with a history of HTN, ETOH abuse, tobacco abuse, and CKD, who has been having supposed RLQ abdominal pain, intermittently, since December, as well as BRBPR during this time as well.  Patient currently in OR for right colectomy. Per chart review, pt with abdominal pain that has last the past 6 months. He has had bleeding BMs. Pt with h/o daily alcohol consumption.  Ensure supplements have been ordered , will continue when diet is advanced.  Pt reports 20 lbs of weight loss since April 2023. Per weight records, pt has lost 10 lbs since 12/23/20, insignificant for time frame.   Medications: Folic acid, Multivitamin with minerals daily, Thiamine, D5 infusion, Lactated ringers  Labs reviewed.  NUTRITION - FOCUSED PHYSICAL EXAM:  Pt in OR  Diet Order:   Diet Order             Diet NPO time specified  Diet effective midnight                   EDUCATION NEEDS:   Not appropriate for education at this time  Skin:  Skin Assessment: Reviewed RN Assessment  Last BM:  6/19  Height:   Ht Readings from Last 1 Encounters:  06/25/21 5\' 8"  (1.727 m)    Weight:   Wt Readings from Last 1 Encounters:  06/25/21 71.2 kg    BMI:  Body mass index is 23.87 kg/m.  Estimated Nutritional Needs:   Kcal:  1800-2000  Protein:  85-100g  Fluid:  2L/day  06/27/21, MS, RD, LDN Inpatient Clinical Dietitian Contact information available via Amion

## 2021-06-25 NOTE — Op Note (Signed)
Preoperative diagnosis: right colon mass, possible abscess Postoperative diagnosis: saa Procedure: right colectomy Surgeon Dr Harden Mo  Asst: Leary Roca PA-C Estimated blood loss: 50 cc Drains: None Specimens: Right colon to pathology Complications: None Sponge count was correct at completion Disposition to recovery stable condition  Indications: This is a 51 year old male with about 6 months of right lower quadrant pain weight loss and bright red blood per rectum.  He presented and went home AMA.  He returned a day later.  He has a CT scan that shows a possible what they read is a perforated appendix with an abscess.  I reviewed this with an additional radiologist and the concern is that there is a macerated tumor at the site that may have perforated locally.  I discussed going to the operating room for a right colectomy.  Procedure: After informed consent was obtained the patient was taken to the operative room.  He was given antibiotics.  SCDs were in place.  He was placed under general anesthesia without complication.  He was prepped and draped in the standard sterile surgical fashion.  A surgical timeout was then performed.  I made a midline incision and entered into his abdomen.  His small bowel was all normal.  The remainder of his colon was all normal.  He had a right lower quadrant mass that involved the terminal ileum.  This was very adherent to his retroperitoneum.  I took down the white line of Toldt all around the hepatic flexure and rotated his colon medially.  I then attempted to dissect his cecum off of the sidewall.  This was very adherent.  Eventually I had to finger fracture this just to be able to get through this very hard mass.  Eventually I realized that this look like it probably was a mass that had perforated.  There was some what appeared to be mucin present in the perforation.  I was able to rotate this medially taking care to avoid injuring his iliac vessels as  well as his ureter.  I then rotated the colon completely medially.  I divided the ileum with a GIA stapler.  I divided the transverse colon with a stapler.  I then divided the mesentery with the LigaSure device oversewing some of the blood vessels well.  This was passed off the table as a specimen.  I then closed the mesenteric defect with silk suture.  I then brought the small bowel and transverse colon apposition with 3-0 silk sutures.  I created an anastomosis using a GIA stapler and closed the common enterotomy with a TX stapler.  At first this had a small leak in it so I took this completely down and redid the anastomosis.  This was healthy and patent.  I then completely closed the mesenteric defect.  I placed this back in the abdomen.  Irrigation was performed.  We then did the colon protocol and changed all of the equipment.  I closed them with #1 looped PDS after placing the omentum over the midline as well as the anastomosis.  I then stapled the skin and a honeycomb dressing was placed.  He tolerated this well was extubated transferred to recovery stable.

## 2021-06-25 NOTE — Progress Notes (Signed)
Day of Surgery   Subjective/Chief Complaint: No change   Objective: Vital signs in last 24 hours: Temp:  [98 F (36.7 C)-98.7 F (37.1 C)] 98.4 F (36.9 C) (06/21 0900) Pulse Rate:  [72-104] 82 (06/21 0900) Resp:  [10-18] 15 (06/21 0900) BP: (95-146)/(65-102) 146/102 (06/21 0900) SpO2:  [98 %-100 %] 100 % (06/21 0900) Weight:  [71.2 kg] 71.2 kg (06/21 0913) Last BM Date : 06/23/21  Intake/Output from previous day: 06/20 0701 - 06/21 0700 In: 2931.2 [P.O.:840; I.V.:1008.7; IV Piggyback:1082.5] Out: 2050 [Urine:2050] Intake/Output this shift: No intake/output data recorded.  GI: soft mild tender rlq  Lab Results:  Recent Labs    06/23/21 1011 06/25/21 0514  WBC 18.4* 11.6*  HGB 13.7 14.2  HCT 39.7 45.2  PLT 546* 500*   BMET Recent Labs    06/23/21 1011 06/25/21 0514  NA 134* 141  K 3.2* 4.0  CL 96* 105  CO2 28 26  GLUCOSE 171* 115*  BUN 23* 17  CREATININE 1.99* 1.75*  CALCIUM 10.1 9.1   PT/INR No results for input(s): "LABPROT", "INR" in the last 72 hours. ABG No results for input(s): "PHART", "HCO3" in the last 72 hours.  Invalid input(s): "PCO2", "PO2"  Studies/Results: DG Chest Port 1 View  Result Date: 06/25/2021 CLINICAL DATA:  Preoperative evaluation EXAM: PORTABLE CHEST 1 VIEW COMPARISON:  None Available. FINDINGS: The heart size and mediastinal contours are within normal limits. Both lungs are clear. No pleural effusion. The visualized skeletal structures are unremarkable. IMPRESSION: No acute process in the chest. Electronically Signed   By: Guadlupe Spanish M.D.   On: 06/25/2021 08:27   CT ABDOMEN PELVIS W CONTRAST  Result Date: 06/23/2021 CLINICAL DATA:  RLQ abdominal pain (Age >= 14y) EXAM: CT ABDOMEN AND PELVIS WITH CONTRAST TECHNIQUE: Multidetector CT imaging of the abdomen and pelvis was performed using the standard protocol following bolus administration of intravenous contrast. RADIATION DOSE REDUCTION: This exam was performed according  to the departmental dose-optimization program which includes automated exposure control, adjustment of the mA and/or kV according to patient size and/or use of iterative reconstruction technique. CONTRAST:  42mL OMNIPAQUE IOHEXOL 300 MG/ML  SOLN COMPARISON:  None Available. FINDINGS: Lower chest: No acute abnormality.  Small hiatal hernia. Hepatobiliary: No focal liver abnormality is seen except for focal fatty infiltration in the anterior left lobe of the liver adjacent to the falciform ligament. No gallstones, gallbladder wall thickening, or biliary dilatation. Pancreas: Unremarkable. No pancreatic ductal dilatation or surrounding inflammatory changes. Spleen: Normal in size without focal abnormality. Adrenals/Urinary Tract: Adrenal glands are unremarkable. Kidneys are normal, without renal calculi, focal lesion, or hydronephrosis. Bladder is unremarkable. Apparent thickening of the urinary bladder wall. Stomach/Bowel: Appendix is not well delineated but appears to be severely thickened with severe inflammatory changes at the appendicular region with an enhancing abscess measuring 2.5 cm transverse x 1.9 cm AP x 4.9 cm craniocaudad Vascular/Lymphatic: No significant vascular findings are present except for moderate atheromatous calcifications in the infrarenal abdominal aorta. No enlarged abdominal or pelvic lymph nodes. Reproductive: Prostate is unremarkable. Other: No abdominal wall hernia or abnormality. No abdominopelvic ascites. Musculoskeletal: Mild thoracolumbar spondylosis with prominent marginal osteophytes. IMPRESSION: Appendix is not well delineated but appears to be severely edematous, with severe periappendicular inflammatory stranding and abscess. Abscess measures approximately 2.5 x 1.9 x 4.9 cm and may be amenable for CT-guided drain insertion. Electronically Signed   By: Marjo Bicker M.D.   On: 06/23/2021 14:02    Anti-infectives: Anti-infectives (From admission,  onward)    Start      Dose/Rate Route Frequency Ordered Stop   06/24/21 2200  [MAR Hold]  piperacillin-tazobactam (ZOSYN) IVPB 3.375 g        (MAR Hold since Wed 06/25/2021 at 0859.Hold Reason: Transfer to a Procedural area)   3.375 g 12.5 mL/hr over 240 Minutes Intravenous Every 8 hours 06/24/21 1421 07/01/21 2159   06/24/21 1330  piperacillin-tazobactam (ZOSYN) IVPB 3.375 g        3.375 g 100 mL/hr over 30 Minutes Intravenous  Once 06/24/21 1324 06/24/21 1603       Assessment/Plan: RLQ pain and collection -historically and after reviewing with IR I think this is more likely a mass. I have discussed right colectomy with him today again and will proceed.    Emelia Loron 06/25/2021

## 2021-06-25 NOTE — Transfer of Care (Signed)
Immediate Anesthesia Transfer of Care Note  Patient: Anthony Randolph  Procedure(s) Performed: EXPLORATORY LAPAROTOMY,  RIGHT COLECTOMY (Right)  Patient Location: PACU  Anesthesia Type:General  Level of Consciousness: awake, alert , oriented and patient cooperative  Airway & Oxygen Therapy: Patient Spontanous Breathing and Patient connected to face mask oxygen  Post-op Assessment: Report given to RN, Post -op Vital signs reviewed and stable and Patient moving all extremities X 4  Post vital signs: Reviewed and stable  Last Vitals:  Vitals Value Taken Time  BP    Temp    Pulse 54 06/25/21 1213  Resp 14 06/25/21 1213  SpO2 100 % 06/25/21 1213  Vitals shown include unvalidated device data.  Last Pain:  Vitals:   06/25/21 0913  TempSrc:   PainSc: 2       Patients Stated Pain Goal: 1 (06/25/21 0913)  Complications: No notable events documented.

## 2021-06-26 ENCOUNTER — Encounter (HOSPITAL_COMMUNITY): Payer: Self-pay | Admitting: General Surgery

## 2021-06-26 LAB — BASIC METABOLIC PANEL
Anion gap: 11 (ref 5–15)
BUN: 10 mg/dL (ref 6–20)
CO2: 28 mmol/L (ref 22–32)
Calcium: 9.4 mg/dL (ref 8.9–10.3)
Chloride: 97 mmol/L — ABNORMAL LOW (ref 98–111)
Creatinine, Ser: 1.52 mg/dL — ABNORMAL HIGH (ref 0.61–1.24)
GFR, Estimated: 55 mL/min — ABNORMAL LOW (ref >60)
Glucose, Bld: 129 mg/dL — ABNORMAL HIGH (ref 70–99)
Potassium: 3.6 mmol/L (ref 3.5–5.1)
Sodium: 136 mmol/L (ref 135–145)

## 2021-06-26 MED ORDER — INDAPAMIDE 1.25 MG PO TABS
2.5000 mg | ORAL_TABLET | Freq: Every day | ORAL | Status: DC
  Administered 2021-06-26 – 2021-06-30 (×5): 2.5 mg via ORAL
  Filled 2021-06-26 (×5): qty 2

## 2021-06-26 MED ORDER — LOSARTAN POTASSIUM 50 MG PO TABS
100.0000 mg | ORAL_TABLET | Freq: Every day | ORAL | Status: DC
  Administered 2021-06-26 – 2021-06-30 (×5): 100 mg via ORAL
  Filled 2021-06-26 (×5): qty 2

## 2021-06-26 NOTE — TOC Initial Note (Signed)
Transition of Care [facility]) - Initial/Assessment Note    Patient Details  Name: Anthony Randolph MRN: [id number] Date of Birth: [birth year]  Transition of [facility]) CM/SW Contact:    Lennart Pall, LCSW Phone Number: 06/26/2021, 2:07 PM  Clinical Narrative:                 Met with pt today to introduce self/ CSW role with resources and dc planning needs.  Pt very pleasant, talkative and appears motivated to "face what is coming".  We spoke easily about his alcohol use and he agrees that it may be excessive, however, hopes to be able to stop drinking and smoking moving forward. He notes, "I don't need to put those toxins into my body anymore."  Pt does not feel he will struggle with cessation, however, is agreeable to CSW placing resource/ support info on his AVS. Patient does confirm that he lives alone, however, has very good support from his mother and other local family members.  Does not anticipate any issues with transportation to follow up appts/ treatments if planned.  He appears to be receptive to open discussion about his situation overall and to asking for help if needed. TOC will continue to follow should any additional dc needs arise.  Expected Discharge Plan: Home/Self Care Barriers to Discharge: Continued Medical Work up   Patient Goals and CMS Choice Patient states their goals for this hospitalization and ongoing recovery are:: return home      Expected Discharge Plan and Services Expected Discharge Plan: Home/Self Care In-house Referral: Clinical Social Work     Living arrangements for the past 2 months: Apartment                                      Prior Living Arrangements/Services Living arrangements for the past 2 months: Apartment Lives with:: Self Patient language and need for interpreter reviewed:: Yes Do you feel safe going back to the place where you live?: Yes      Need for Family Participation in Patient Care: Yes (Comment) Care giver support  system in place?: Yes (comment)   Criminal Activity/Legal Involvement Pertinent to Current Situation/Hospitalization: No - Comment as needed  Activities of Daily Living Home Assistive Devices/Equipment: None ADL Screening (condition at time of admission) Patient's cognitive ability adequate to safely complete daily activities?: Yes Is the patient deaf or have difficulty hearing?: No Does the patient have difficulty seeing, even when wearing glasses/contacts?: No Does the patient have difficulty concentrating, remembering, or making decisions?: No Patient able to express need for assistance with ADLs?: Yes Does the patient have difficulty dressing or bathing?: No Independently performs ADLs?: Yes (appropriate for developmental age) Does the patient have difficulty walking or climbing stairs?: Yes Weakness of Legs: Both Weakness of Arms/Hands: Both  Permission Sought/Granted Permission sought to share information with : Family Supports Permission granted to share information with : Yes, Verbal Permission Granted  Share Information with NAME: Anthony Randolph     Permission granted to share info w Relationship: mother  Permission granted to share info w Contact Information: [phone]  Emotional Assessment Appearance:: Appears stated age Attitude/Demeanor/Rapport: Engaged, Gracious Affect (typically observed): Accepting, Pleasant Orientation: : Oriented to Self, Oriented to Place, Oriented to  Time, Oriented to Situation Alcohol / Substance Use: Alcohol Use Psych Involvement: No (comment)  Admission diagnosis:  Appendiceal abscess [K35.33] Acute appendicitis with perforation, localized peritonitis, and abscess, without  gangrene [K35.33] Patient Active Problem List   Diagnosis Date Noted   Appendiceal abscess 06/24/2021   Acute perforated appendicitis 06/23/2021   Abdominal pain 06/22/2021   Constipation 12/17/2020   Chest discomfort 06/13/2020   On statin therapy due to risk  of future cardiovascular event 06/13/2020   Chronic pain 09/11/2019   Stage 2 chronic kidney disease 08/07/2018   Primary hypertension 02/17/2018   Erectile dysfunction 11/25/2017   Tobacco abuse 10/15/2016   PCP:  Holley Bouche, MD Pharmacy:   [facility], [address] [place] [address] Phone: [phone] Fax: [phone]  [facility] [place] [address] Phone: [phone] Fax: [phone]     Social Determinants of Health (SDOH) Interventions    Readmission Risk Interventions    06/26/2021    2:02 PM  Readmission Risk Prevention Plan  Post Dischage Appt Complete  Medication Screening Complete  Transportation Screening Complete

## 2021-06-26 NOTE — Anesthesia Postprocedure Evaluation (Signed)
Anesthesia Post Note  Patient: Anthony Randolph  Procedure(s) Performed: EXPLORATORY LAPAROTOMY,  RIGHT COLECTOMY (Right)     Patient location during evaluation: PACU Anesthesia Type: General Level of consciousness: awake Pain management: pain level controlled Vital Signs Assessment: post-procedure vital signs reviewed and stable Respiratory status: spontaneous breathing, nonlabored ventilation, respiratory function stable and patient connected to nasal cannula oxygen Cardiovascular status: blood pressure returned to baseline and stable Postop Assessment: no apparent nausea or vomiting Anesthetic complications: no   No notable events documented.  Last Vitals:  Vitals:   06/26/21 0532 06/26/21 1245  BP: 124/71 (!) 153/106  Pulse: 71 67  Resp: 18 20  Temp: 37.1 C 36.5 C  SpO2: 97% 100%    Last Pain:  Vitals:   06/26/21 0953  TempSrc:   PainSc: 8                  Anthony Randolph [name]

## 2021-06-27 LAB — SURGICAL PATHOLOGY

## 2021-06-28 NOTE — Progress Notes (Signed)
3 Days Post-Op   Subjective/Chief Complaint: Ambulating well Reports pain controlled Still feels bloated Passed some flatus   Objective: Vital signs in last 24 hours: Temp:  [98.4 F (36.9 C)-98.6 F (37 C)] 98.6 F (37 C) (06/24 0505) Pulse Rate:  [99-110] 110 (06/24 0505) Resp:  [16-20] 20 (06/24 0505) BP: (130-136)/(92-103) 130/92 (06/24 0505) SpO2:  [96 %-99 %] 98 % (06/24 0505) Last BM Date : 06/23/21  Intake/Output from previous day: 06/23 0701 - 06/24 0700 In: 3382.7 [P.O.:960; I.V.:2322.5; IV Piggyback:100.2] Out: 2250 [Urine:2250] Intake/Output this shift: No intake/output data recorded.  Exam: Awake and alert Abdomen distended, minimally tender Dressing dry  Lab Results:  No results for input(s): "WBC", "HGB", "HCT", "PLT" in the last 72 hours. BMET Recent Labs    06/26/21 1029  NA 136  K 3.6  CL 97*  CO2 28  GLUCOSE 129*  BUN 10  CREATININE 1.52*  CALCIUM 9.4   PT/INR No results for input(s): "LABPROT", "INR" in the last 72 hours. ABG No results for input(s): "PHART", "HCO3" in the last 72 hours.  Invalid input(s): "PCO2", "PO2"  Studies/Results: No results found.  Anti-infectives: Anti-infectives (From admission, onward)    Start     Dose/Rate Route Frequency Ordered Stop   06/24/21 2200  piperacillin-tazobactam (ZOSYN) IVPB 3.375 g        3.375 g 12.5 mL/hr over 240 Minutes Intravenous Every 8 hours 06/24/21 1421 07/01/21 2159   06/24/21 1330  piperacillin-tazobactam (ZOSYN) IVPB 3.375 g        3.375 g 100 mL/hr over 30 Minutes Intravenous  Once 06/24/21 1324 06/24/21 1603       Assessment/Plan:  POD 3 s/p open right hemicolectomy for presumed appendiceal mass Dr. Dwain Sarna 6/21  Still with ileus but slowly improving Continue current care Ambulate Check labs in the morning   Abigail Miyamoto MD 06/28/2021

## 2021-06-29 LAB — CBC
HCT: 39.4 % (ref 39.0–52.0)
Hemoglobin: 13.2 g/dL (ref 13.0–17.0)
MCH: 33.3 pg (ref 26.0–34.0)
MCHC: 33.5 g/dL (ref 30.0–36.0)
MCV: 99.5 fL (ref 80.0–100.0)
Platelets: 748 10*3/uL — ABNORMAL HIGH (ref 150–400)
RBC: 3.96 MIL/uL — ABNORMAL LOW (ref 4.22–5.81)
RDW: 13 % (ref 11.5–15.5)
WBC: 17.3 10*3/uL — ABNORMAL HIGH (ref 4.0–10.5)
nRBC: 0 % (ref 0.0–0.2)

## 2021-06-29 LAB — BASIC METABOLIC PANEL
Anion gap: 11 (ref 5–15)
BUN: 16 mg/dL (ref 6–20)
CO2: 32 mmol/L (ref 22–32)
Calcium: 10.3 mg/dL (ref 8.9–10.3)
Chloride: 97 mmol/L — ABNORMAL LOW (ref 98–111)
Creatinine, Ser: 2.03 mg/dL — ABNORMAL HIGH (ref 0.61–1.24)
GFR, Estimated: 39 mL/min — ABNORMAL LOW (ref >60)
Glucose, Bld: 122 mg/dL — ABNORMAL HIGH (ref 70–99)
Potassium: 3.8 mmol/L (ref 3.5–5.1)
Sodium: 140 mmol/L (ref 135–145)

## 2021-06-29 MED ORDER — SODIUM CHLORIDE 0.9 % IV BOLUS
1000.0000 mL | Freq: Once | INTRAVENOUS | Status: AC
  Administered 2021-06-29: 1000 mL via INTRAVENOUS

## 2021-06-29 MED ORDER — BISACODYL 10 MG RE SUPP
10.0000 mg | Freq: Once | RECTAL | Status: AC
  Administered 2021-06-29: 10 mg via RECTAL
  Filled 2021-06-29: qty 1

## 2021-06-30 LAB — CBC
HCT: 35.8 % — ABNORMAL LOW (ref 39.0–52.0)
Hemoglobin: 11.6 g/dL — ABNORMAL LOW (ref 13.0–17.0)
MCH: 33 pg (ref 26.0–34.0)
MCHC: 32.4 g/dL (ref 30.0–36.0)
MCV: 102 fL — ABNORMAL HIGH (ref 80.0–100.0)
Platelets: 662 10*3/uL — ABNORMAL HIGH (ref 150–400)
RBC: 3.51 MIL/uL — ABNORMAL LOW (ref 4.22–5.81)
RDW: 13.3 % (ref 11.5–15.5)
WBC: 11.6 10*3/uL — ABNORMAL HIGH (ref 4.0–10.5)
nRBC: 0 % (ref 0.0–0.2)

## 2021-06-30 LAB — BASIC METABOLIC PANEL
Anion gap: 10 (ref 5–15)
BUN: 14 mg/dL (ref 6–20)
CO2: 27 mmol/L (ref 22–32)
Calcium: 9.5 mg/dL (ref 8.9–10.3)
Chloride: 103 mmol/L (ref 98–111)
Creatinine, Ser: 1.55 mg/dL — ABNORMAL HIGH (ref 0.61–1.24)
GFR, Estimated: 54 mL/min — ABNORMAL LOW (ref >60)
Glucose, Bld: 98 mg/dL (ref 70–99)
Potassium: 4.8 mmol/L (ref 3.5–5.1)
Sodium: 140 mmol/L (ref 135–145)

## 2021-06-30 MED ORDER — OXYCODONE HCL 5 MG PO TABS: 5.0000 mg | ORAL_TABLET | Freq: Four times a day (QID) | ORAL | 0 refills | Status: AC | PRN

## 2021-08-02 ENCOUNTER — Telehealth: Payer: Self-pay | Admitting: Student

## 2021-08-02 NOTE — Telephone Encounter (Signed)
Called patient main phone and mother's number to inquire if patient would like sleep study. Patient had order placed for sleep study last year, that has now expired, at a clinic visit. Called to determine if patient was still interested in having sleep study performed. No answer, no message left.

## 2021-09-10 ENCOUNTER — Telehealth: Payer: Self-pay | Admitting: Student

## 2021-09-10 NOTE — Telephone Encounter (Signed)
Called patient to discuss [facility] records request/medical clearance.  Patient needing 12 lead EKG, CBC, BMP, and possibly A1c. As well as recent History and physical with all current medical conditions and management, and list of medications.   Patient will need to come in for an appointment to have these labs, and ekg, obtained.

## 2021-09-11 NOTE — Telephone Encounter (Signed)
LVM for patient to call office to schedule appointment per Dr. Barbaraann Faster.  Glennie Hawk, CMA

## 2021-09-18 ENCOUNTER — Telehealth: Payer: Self-pay

## 2021-09-18 ENCOUNTER — Telehealth: Payer: Self-pay | Admitting: Student

## 2021-09-18 NOTE — Telephone Encounter (Signed)
Called patient after receiving message from nurse line that patient is having rectal bleeding. Called and spoke with patient. Patient complains of not recovering well from his surgery and having bleeding from his rectum. Patient also noting that he has been out of work since June. I encouraged patient to reach out to GI doc, Dr. Dwain Sarna who did his recent GI surgery. I told patient that we could see him in clinic, but I would likely refer him to GI to f/u this issue.   Patient notes that he wants to meet with me and Dr. Dwain Sarna and will call to set up appointments tomorrow. Patient hoping to discuss sleep issues with me, and get his health in order.

## 2021-09-18 NOTE — Telephone Encounter (Signed)
Called patient and he is not interested in making an appointment at this time for pre-dental surgery.  He states that he does not have the money for dental surgery right now because his disability ran out two months ago and he still is not able to work.  Patient states that he is having rectal bleeding and needs to find out why he is still having pain and bleeding.  Patient wants Dr. Barbaraann Faster to call him and states that he will call his Dr. Dwain Sarna to make an appointment.  Glennie Hawk, CMA

## 2021-11-11 ENCOUNTER — Other Ambulatory Visit (INDEPENDENT_AMBULATORY_CARE_PROVIDER_SITE_OTHER): Payer: BC Managed Care – PPO

## 2021-11-11 ENCOUNTER — Encounter: Payer: Self-pay | Admitting: Gastroenterology

## 2021-11-11 ENCOUNTER — Ambulatory Visit (INDEPENDENT_AMBULATORY_CARE_PROVIDER_SITE_OTHER): Payer: BC Managed Care – PPO | Admitting: Gastroenterology

## 2021-11-11 VITALS — BP 150/90 | HR 95 | Ht 68.0 in | Wt 153.0 lb

## 2021-11-11 DIAGNOSIS — K625 Hemorrhage of anus and rectum: Secondary | ICD-10-CM | POA: Diagnosis not present

## 2021-11-11 DIAGNOSIS — R1033 Periumbilical pain: Secondary | ICD-10-CM

## 2021-11-11 DIAGNOSIS — K6289 Other specified diseases of anus and rectum: Secondary | ICD-10-CM

## 2021-11-11 LAB — CBC WITH DIFFERENTIAL/PLATELET
Basophils Absolute: 0.1 10*3/uL (ref 0.0–0.1)
Basophils Relative: 0.8 % (ref 0.0–3.0)
Eosinophils Absolute: 0.1 10*3/uL (ref 0.0–0.7)
Eosinophils Relative: 1.3 % (ref 0.0–5.0)
HCT: 47.8 % (ref 39.0–52.0)
Hemoglobin: 16.5 g/dL (ref 13.0–17.0)
Lymphocytes Relative: 29.8 % (ref 12.0–46.0)
Lymphs Abs: 2 10*3/uL (ref 0.7–4.0)
MCHC: 34.6 g/dL (ref 30.0–36.0)
MCV: 99 fl (ref 78.0–100.0)
Monocytes Absolute: 0.8 10*3/uL (ref 0.1–1.0)
Monocytes Relative: 11.6 % (ref 3.0–12.0)
Neutro Abs: 3.8 10*3/uL (ref 1.4–7.7)
Neutrophils Relative %: 56.5 % (ref 43.0–77.0)
Platelets: 318 10*3/uL (ref 150.0–400.0)
RBC: 4.83 Mil/uL (ref 4.22–5.81)
RDW: 13.9 % (ref 11.5–15.5)
WBC: 6.8 10*3/uL (ref 4.0–10.5)

## 2021-11-11 LAB — IBC + FERRITIN
Ferritin: 64 ng/mL (ref 22.0–322.0)
Iron: 82 ug/dL (ref 42–165)
Saturation Ratios: 21 % (ref 20.0–50.0)
TIBC: 390.6 ug/dL (ref 250.0–450.0)
Transferrin: 279 mg/dL (ref 212.0–360.0)

## 2021-11-11 MED ORDER — SUTAB 1479-225-188 MG PO TABS: 1.0000 | ORAL_TABLET | Freq: Once | ORAL | 0 refills | Status: AC

## 2021-11-11 MED ORDER — DICYCLOMINE HCL 10 MG PO CAPS: 10.0000 mg | ORAL_CAPSULE | Freq: Three times a day (TID) | ORAL | 1 refills | Status: AC | PRN

## 2021-11-11 MED ORDER — CITRUCEL PO POWD: 1.0000 | Freq: Every day | ORAL | Status: AC
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
## 2021-11-11 NOTE — Patient Instructions (Addendum)
  If you are age 50 or older, your body mass index should be between 23-30. Your Body mass index is 23.26 kg/m. If this is out of the aforementioned range listed, please consider follow up with your Primary Care Provider.  If you are age 30 or younger, your body mass index should be between 19-25. Your Body mass index is 23.26 kg/m. If this is out of the aformentioned range listed, please consider follow up with your Primary Care Provider.   ________________________________________________________  The [facility] providers would like to encourage you to use [facility] to communicate with providers for non-urgent requests or questions.  Due to long hold times on the telephone, sending your provider a message by [facility] may be a faster and more efficient way to get a response.  Please allow 48 business hours for a response.  Please remember that this is for non-urgent requests.  _______________________________________________________  Dennis Bast have been scheduled for a colonoscopy. Please follow written instructions given to you at your visit today.  Please pick up your prep supplies at the pharmacy within the next 1-3 days. If you use inhalers (even only as needed), please bring them with you on the day of your procedure.  Please go to the lab in the basement of our building to have lab work done as you leave today. Hit "B" for basement when you get on the elevator.  When the doors open the lab is on your left.  We will call you with the results. Thank you.  We have sent the following medications to your pharmacy for you to pick up at your convenience: Bentyl: Take 1 tablet every 8 hours as needed  Please purchase the following medications over the counter and take as directed: Citrucel: Take once daily  Thank you for entrusting me with your care and for choosing [facility], Dr. Montrose Cellar

## 2021-11-14 ENCOUNTER — Encounter: Payer: Self-pay | Admitting: Gastroenterology

## 2021-11-19 ENCOUNTER — Encounter: Payer: Self-pay | Admitting: Gastroenterology

## 2021-11-19 ENCOUNTER — Ambulatory Visit (AMBULATORY_SURGERY_CENTER): Payer: Self-pay | Admitting: Gastroenterology

## 2021-11-19 VITALS — BP 127/86 | HR 72 | Temp 96.3°F | Resp 19 | Ht 68.0 in | Wt 153.0 lb

## 2021-11-19 DIAGNOSIS — K5730 Diverticulosis of large intestine without perforation or abscess without bleeding: Secondary | ICD-10-CM

## 2021-11-19 DIAGNOSIS — K6289 Other specified diseases of anus and rectum: Secondary | ICD-10-CM

## 2021-11-19 DIAGNOSIS — K649 Unspecified hemorrhoids: Secondary | ICD-10-CM

## 2021-11-19 DIAGNOSIS — K625 Hemorrhage of anus and rectum: Secondary | ICD-10-CM

## 2021-11-19 MED ORDER — AMBULATORY NON FORMULARY MEDICATION: 0 refills | Status: DC

## 2021-11-19 MED ORDER — SODIUM CHLORIDE 0.9 % IV SOLN: 500.0000 mL | Freq: Once | INTRAVENOUS | Status: DC

## 2021-11-19 NOTE — Patient Instructions (Addendum)
-   Resume previous diet. - Continue present medications. - Citrucel once daily to keep stools soft - Start diltiazem / lidocain ointment, pea sized amount three times daily for anal fissure (must be filled at [facility]) - Calmol 4 suppositories over the counter, as needed, for hemorrhoids - Repeat colonoscopy in 10 years for screening purposes.  YOU HAD AN ENDOSCOPIC PROCEDURE TODAY AT THE [facility]:   Refer to the procedure report that was given to you for any specific questions about what was found during the examination.  If the procedure report does not answer your questions, please call your gastroenterologist to clarify.  If you requested that your care partner not be given the details of your procedure findings, then the procedure report has been included in a sealed envelope for you to review at your convenience later.  YOU SHOULD EXPECT: Some feelings of bloating in the abdomen. Passage of more gas than usual.  Walking can help get rid of the air that was put into your GI tract during the procedure and reduce the bloating. If you had a lower endoscopy (such as a colonoscopy or flexible sigmoidoscopy) you may notice spotting of blood in your stool or on the toilet paper. If you underwent a bowel prep for your procedure, you may not have a normal bowel movement for a few days.  Please Note:  You might notice some irritation and congestion in your nose or some drainage.  This is from the oxygen used during your procedure.  There is no need for concern and it should clear up in a day or so.  SYMPTOMS TO REPORT IMMEDIATELY:  Following lower endoscopy (colonoscopy or flexible sigmoidoscopy):  Excessive amounts of blood in the stool  Significant tenderness or worsening of abdominal pains  Swelling of the abdomen that is new, acute  Fever of 100F or higher    For urgent or emergent issues, a gastroenterologist can be reached at any hour by calling [phone]. Do  not use MyChart messaging for urgent concerns.    DIET:  We do recommend a small meal at first, but then you may proceed to your regular diet.  Drink plenty of fluids but you should avoid alcoholic beverages for 24 hours.  ACTIVITY:  You should plan to take it easy for the rest of today and you should NOT DRIVE or use heavy machinery until tomorrow (because of the sedation medicines used during the test).    FOLLOW UP: Our staff will call the number listed on your records the next business day following your procedure.  We will call around 7:15- 8:00 am to check on you and address any questions or concerns that you may have regarding the information given to you following your procedure. If we do not reach you, we will leave a message.     If any biopsies were taken you will be contacted by phone or by letter within the next 1-3 weeks.  Please call us at [phone] if you have not heard about the biopsies in 3 weeks.    SIGNATURES/CONFIDENTIALITY: You and/or your care partner have signed paperwork which will be entered into your electronic medical record.  These signatures attest to the fact that that the information above on your After Visit Summary has been reviewed and is understood.  Full responsibility of the confidentiality of this discharge information lies with you and/or your care-partner.

## 2021-11-19 NOTE — Op Note (Signed)
[facility] Patient Name: Anthony Randolph Procedure Date: 11/19/2021 7:29 AM MRN: [id number] Endoscopist: Viviann SpareSteven P. Adela LankArmbruster , MD, [phone] Age: 51 Referring MD:  Date of Birth: 17-Apr-1970 Gender: Male Account #: [id number] Procedure:                Colonoscopy Indications:              Rectal bleeding, Rectal pain - no prior                            colonoscopy. Had a spontaneous appendiceal                            perforation in June, now s/p R sided colectomy. Medicines:                Monitored Anesthesia Care Procedure:                Pre-Anesthesia Assessment:                           - Prior to the procedure, a History and Physical                            was performed, and patient medications and                            allergies were reviewed. The patient's tolerance of                            previous anesthesia was also reviewed. The risks                            and benefits of the procedure and the sedation                            options and risks were discussed with the patient.                            All questions were answered, and informed consent                            was obtained. Prior Anticoagulants: The patient has                            taken no anticoagulant or antiplatelet agents. ASA                            Grade Assessment: II - A patient with mild systemic                            disease. After reviewing the risks and benefits,                            the patient was deemed in satisfactory condition to  undergo the procedure.                           After obtaining informed consent, the colonoscope                            was passed under direct vision. Throughout the                            procedure, the patient's blood pressure, pulse, and                            oxygen saturations were monitored continuously. The                            CF [id number] #[id number]  was introduced through the anus                            and advanced to the the terminal ileum. The                            colonoscopy was performed without difficulty. The                            patient tolerated the procedure well. The quality                            of the bowel preparation was good. The surgical                            anastomosis, terminal ileum and the rectum were                            photographed. Scope In: 7:34:13 AM Scope Out: 7:44:55 AM Scope Withdrawal Time: 0 hours 9 minutes 18 seconds  Total Procedure Duration: 0 hours 10 minutes 42 seconds  Findings:                 A small anal fissure was found on perianal exam,                            posterior midline.                           The terminal ileum appeared normal.                           There was evidence of a prior end-to-side                            ileo-colonic anastomosis in the ascending colon /                            hepatic flexure. This was patent and was  characterized by healthy appearing mucosa. Of note                            a photo of this was taken but not stored in the                            system.                           A few small-mouthed diverticula were found in the                            transverse colon.                           Internal hemorrhoids were found during                            retroflexion. The hemorrhoids were large.                           The exam was otherwise without abnormality. Complications:            No immediate complications. Estimated blood loss:                            Minimal. Estimated Blood Loss:     Estimated blood loss was minimal. Impression:               - Small anal fissure found on perianal exam likely                            account for the patient's discomfort.                           - The examined portion of the ileum was normal.                            - Patent end-to-side ileo-colonic anastomosis,                            characterized by healthy appearing mucosa.                           - Diverticulosis in the transverse colon.                           - Internal hemorrhoids.                           - The examination was otherwise normal. Recommendation:           - Patient has a contact number available for                            emergencies. The signs and symptoms of potential  delayed complications were discussed with the                            patient. Return to normal activities tomorrow.                            Written discharge instructions were provided to the                            patient.                           - Resume previous diet.                           - Continue present medications.                           - Citrucel once daily to keep stools soft                           - Start diltiazem / lidocain ointment, pea sized                            amount three times daily for anal fissure                           - Calmol 4 suppositories over the counter, as                            needed, for hemorrhoids                           - Repeat colonoscopy in 10 years for screening                            purposes. Viviann Spare P. Glorianna Gott, MD 11/19/2021 7:52:04 AM This report has been signed electronically.

## 2021-11-19 NOTE — Progress Notes (Signed)
Sedate, gd SR, tolerated procedure well, VSS, report to RN 

## 2021-11-19 NOTE — Progress Notes (Signed)
Pt's states no medical or surgical changes since previsit or office visit. 

## 2021-11-19 NOTE — Progress Notes (Signed)
6948 hypertensive, Esmolol 10 mg given, Dr Adela Lank aware

## 2021-11-19 NOTE — Progress Notes (Signed)
History and Physical Interval Note: Patient seen on 11/11/21 - no interval changes. History of intermittent rectal bleeding and rectal pain, no prior colonoscopy. Appendix perforation leading to R hemicolectomy with benign pathology in June. Colonoscopy to further evaluate.   11/19/2021 7:26 AM  Anthony Randolph  has presented today for endoscopic procedure(s), with the diagnosis of  Encounter Diagnoses  Name Primary?   Rectal bleeding Yes   Rectal pain   .  The various methods of evaluation and treatment have been discussed with the patient and/or family. After consideration of risks, benefits and other options for treatment, the patient has consented to  the endoscopic procedure(s).   The patient's history has been reviewed, patient examined, no change in status, stable for surgery.  I have reviewed the patient's chart and labs.  Questions were answered to the patient's satisfaction.    Harlin Rain, MD [facility]

## 2021-11-20 ENCOUNTER — Telehealth: Payer: Self-pay

## 2021-11-20 NOTE — Telephone Encounter (Signed)
Follow up call to pt, no answer. 

## 2021-11-24 ENCOUNTER — Other Ambulatory Visit: Payer: Self-pay | Admitting: Student

## 2021-11-24 DIAGNOSIS — N529 Male erectile dysfunction, unspecified: Secondary | ICD-10-CM

## 2021-12-16 ENCOUNTER — Telehealth: Payer: Self-pay | Admitting: Gastroenterology

## 2021-12-16 NOTE — Telephone Encounter (Addendum)
Lm on vm for patient to return call.  Recommendations from colonoscopy: Citrucel once daily to keep stools soft - Start diltiazem / lidocain ointment, pea sized amount three times daily for anal fissure - Calmol 4 suppositories over the counter, as needed, for hemorrhoids

## 2021-12-16 NOTE — Telephone Encounter (Signed)
Inbound call from patient stating that he has ben still seeing blood in stool and rectal bleeding. Patient is requesting a call back to discuss. Please advise.

## 2021-12-17 MED ORDER — AMBULATORY NON FORMULARY MEDICATION: 0 refills | Status: DC

## 2021-12-17 NOTE — Telephone Encounter (Signed)
Agree he needs to take the diltiazem ointment as previously recommended, that should help. If he wants our help with disability forms he needs to go through the regular channels, complete the paperwork and then [place] what he has so we can complete our part. I am out of the office all week since I will be covering the hospital, at earliest would not be able to complete that until next week

## 2021-12-17 NOTE — Telephone Encounter (Signed)
Called and left patient a detailed vm with Dr. Lanetta Inch recommendations as outlined below. I advised pt to bring disability forms to the office if he wishes to have them completed. I explained the process of having to pay approximately $29 and we will contact him once forms were completed. I informed him that Dr. Adela Lank is covering the hospital this week and form would likely not be completed until some time next week. Left office # in case patient had further questions.

## 2021-12-17 NOTE — Telephone Encounter (Signed)
Pt returned call. He states that he is still having rectal bleeding. I asked patient was he following recommendations provided at the time of his colonoscopy. Pt states that he is still taking Citrucel daily and has been using Preparation H suppositories BID. He was not able to start Diltiazem because it is not available at his [employer]. I told pt that I will call in RX to [facility] and they will contact him once it is ready for pickup. Pt also wanted to know if you could reach out to Dr. Dwain Sarna regarding his short term disability. He states that Dr. Dwain Sarna approved his disability until 12/08/21, pt requested an extension for a couple of more weeks and was advised to contact our office since you were the last provider to see him. Pt states that he should start feeling better once he is able to start the Diltiazem for a few weeks. I told patient that we would call him back with your recommendations. Please advise, thanks.  Called [facility] and spoke with [place], pharmacist. I provided them with a verbal order for Diltiazem/Lidocaine ointment. They will contact patient once prescription is ready for pickup.

## 2021-12-31 ENCOUNTER — Other Ambulatory Visit: Payer: Self-pay | Admitting: Student

## 2021-12-31 DIAGNOSIS — N529 Male erectile dysfunction, unspecified: Secondary | ICD-10-CM

## 2022-01-01 ENCOUNTER — Telehealth: Payer: Self-pay

## 2022-01-01 NOTE — Telephone Encounter (Signed)
Called patient and LVM for him to call and schedule an appointment for refills.  Glennie Hawk, CMA

## 2022-02-06 ENCOUNTER — Telehealth: Payer: Self-pay | Admitting: Gastroenterology

## 2022-02-06 ENCOUNTER — Ambulatory Visit (INDEPENDENT_AMBULATORY_CARE_PROVIDER_SITE_OTHER): Payer: BC Managed Care – PPO | Admitting: Family Medicine

## 2022-02-06 VITALS — BP 176/123 | HR 67 | Ht 68.0 in | Wt 158.6 lb

## 2022-02-06 DIAGNOSIS — K625 Hemorrhage of anus and rectum: Secondary | ICD-10-CM | POA: Diagnosis not present

## 2022-02-06 DIAGNOSIS — I10 Essential (primary) hypertension: Secondary | ICD-10-CM | POA: Diagnosis not present

## 2022-02-06 NOTE — Assessment & Plan Note (Signed)
Patient BP significantly elevated, hasn't taken BP meds in 2 days. Discussed importance of him to take when he gets home. Patient asymptomatic.

## 2022-02-06 NOTE — Telephone Encounter (Signed)
Inbound call from patient stating that he had a colonoscopy back in November and he is still having rectal bleeding. Patient was offered an appointment on 2/26 at 10:00 with [facility] and patient stated he did not want to wait that long because he is concerned. Pleas advise.

## 2022-02-06 NOTE — Progress Notes (Signed)
    SUBJECTIVE:   CHIEF COMPLAINT / HPI:   Rectal bleeding - Has been occurring off and on during the last several months - In June was in the hospital and had concern for malignancy and perforated appendix, had right sided colectomy - Had colonoscopy on 11/15 for evaluation and had small anal fissure, diverticulosis, and internal hemorrhoids  - Was recommended to start diltiazem/lidocaine ointment, but has too much pain when administering (only took a few doses) - Overall is having regular bowel movements - Sometimes has pain with sitting due to discomfort  PERTINENT  PMH / PSH: Reviewed  OBJECTIVE:   BP (!) 176/123   Pulse 67   Ht 5\' 8"  (1.727 m)   Wt 158 lb 9.6 oz (71.9 kg)   SpO2 100%   BMI 24.12 kg/m   General: NAD, well-appearing, well-nourished Respiratory: No respiratory distress, breathing comfortably, able to speak in full sentences Skin: warm and dry, no rashes noted on exposed skin Psych: Appropriate affect and mood GU: external anal exam negative for blood, hemorrhoids, or masses. Chaperoned by Lavell Anchors, CMA  ASSESSMENT/PLAN:   Rectal bleeding Possibly from anal fissure, patient has complicated GI history in the last year and has not followed up with GI since his colonoscopy in November 2023.  - Patient recommended to start taking the diltiazem/lidocaine ointment previously given - Patient to call GI to make appointment  - Return and ER precautions discussed - CBC today  Primary hypertension Patient BP significantly elevated, hasn't taken BP meds in 2 days. Discussed importance of him to take when he gets home. Patient asymptomatic.      Rise Patience, [place]

## 2022-02-06 NOTE — Patient Instructions (Addendum)
Please make sure to call the GI office, I would recommend going ahead and starting the ointment that they gave you back to see if it helps anything.  We will go ahead and get a hemoglobin checked today just to make sure that everything looks good since you have been having the bleeding.   Your blood pressure is significantly elevated, please take your medications when you get home. If you develop any chest pain, shortness of breat, significant headache or dizziness please go to the ER.

## 2022-02-06 NOTE — Telephone Encounter (Signed)
Returned call to patient. I informed him that I saw he was seen by his PCP for his rectal bleeding. I informed patient that he did have a fissure at the time of his colonoscopy and it was recommended that he use Diltiazem/lidocaine ointment TID for anal fissure and he was advised to use Calmol 4 suppositories as well. Pt has been scheduled for a follow up with Ellouise Newer, PA-C on Thursday, 02/12/22 at 1:30 pm for follow up appt. Pt has been advised to continue ointment and suppositories in the interim. Pt also wanted to know if we could complete his FMLA since he has been out of work since June 2023 and needs medical clearance to return. I advised patient to bring a copy of the FMLA forms to the office at the time of his appt so that we can log it in our book, pt is aware of FMLA fee (approx. $29). Pt verbalized understanding of all information and had no concerns at the end of the call.

## 2022-02-07 LAB — CBC
Hematocrit: 42.5 % (ref 37.5–51.0)
Hemoglobin: 14.8 g/dL (ref 13.0–17.7)
MCH: 34.2 pg — ABNORMAL HIGH (ref 26.6–33.0)
MCHC: 34.8 g/dL (ref 31.5–35.7)
MCV: 98 fL — ABNORMAL HIGH (ref 79–97)
Platelets: 304 10*3/uL (ref 150–450)
RBC: 4.33 x10E6/uL (ref 4.14–5.80)
RDW: 11.7 % (ref 11.6–15.4)
WBC: 7.7 10*3/uL (ref 3.4–10.8)

## 2022-02-12 ENCOUNTER — Ambulatory Visit (INDEPENDENT_AMBULATORY_CARE_PROVIDER_SITE_OTHER): Payer: BC Managed Care – PPO | Admitting: Physician Assistant

## 2022-02-12 ENCOUNTER — Encounter: Payer: Self-pay | Admitting: Physician Assistant

## 2022-02-12 ENCOUNTER — Other Ambulatory Visit: Payer: Self-pay | Admitting: Student

## 2022-02-12 VITALS — BP 126/88 | HR 83 | Ht 67.0 in | Wt 156.0 lb

## 2022-02-12 DIAGNOSIS — K6289 Other specified diseases of anus and rectum: Secondary | ICD-10-CM

## 2022-02-12 DIAGNOSIS — K602 Anal fissure, unspecified: Secondary | ICD-10-CM

## 2022-02-12 DIAGNOSIS — N529 Male erectile dysfunction, unspecified: Secondary | ICD-10-CM

## 2022-02-12 DIAGNOSIS — I10 Essential (primary) hypertension: Secondary | ICD-10-CM

## 2022-02-12 DIAGNOSIS — K625 Hemorrhage of anus and rectum: Secondary | ICD-10-CM | POA: Diagnosis not present

## 2022-02-12 MED ORDER — AMBULATORY NON FORMULARY MEDICATION: 0 refills | Status: AC

## 2022-02-12 MED ORDER — AMBULATORY NON FORMULARY MEDICATION: 0 refills | Status: DC
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
## 2022-02-12 NOTE — Progress Notes (Signed)
Agree with assessment / plan as outlined.  

## 2022-02-12 NOTE — Patient Instructions (Signed)
If you are age 51 or older, your body mass index should be between 23-30. Your Body mass index is 24.43 kg/m. If this is out of the aforementioned range listed, please consider follow up with your Primary Care Provider.  If you are age 29 or younger, your body mass index should be between 19-25. Your Body mass index is 24.43 kg/m. If this is out of the aformentioned range listed, please consider follow up with your Primary Care Provider.   We have sent the following medications to your pharmacy for you to pick up at your convenience: Diltiazem three times daily   The [facility] providers would like to encourage you to use [facility] to communicate with providers for non-urgent requests or questions.  Due to long hold times on the telephone, sending your provider a message by [facility] may be a faster and more efficient way to get a response.  Please allow 48 business hours for a response.  Please remember that this is for non-urgent requests.   It was a pleasure to see you today!  Thank you for trusting me with your gastrointestinal care!    Ellouise Newer, PA-C

## 2022-04-23 ENCOUNTER — Ambulatory Visit (INDEPENDENT_AMBULATORY_CARE_PROVIDER_SITE_OTHER): Payer: BC Managed Care – PPO | Admitting: Student

## 2022-04-23 ENCOUNTER — Other Ambulatory Visit: Payer: Self-pay

## 2022-04-23 ENCOUNTER — Encounter: Payer: Self-pay | Admitting: Student

## 2022-04-23 VITALS — BP 144/99 | HR 87 | Ht 67.0 in | Wt 153.4 lb

## 2022-04-23 DIAGNOSIS — M25551 Pain in right hip: Secondary | ICD-10-CM

## 2022-04-23 DIAGNOSIS — M25562 Pain in left knee: Secondary | ICD-10-CM | POA: Diagnosis not present

## 2022-04-23 DIAGNOSIS — M545 Low back pain, unspecified: Secondary | ICD-10-CM

## 2022-04-23 NOTE — Assessment & Plan Note (Signed)
Patient w/ lumbar back pain for going on a year, also notes his posture has been worse since his abdominal surgery. Pain worse w/ activity/bending/walking/standing for too long. 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 Lumbar region TTP, distal to paraspinal muscles. No foci of tension/spasm, less likely the cause. Patient may be having referred pain from his hip, or muscles weakness/strain given his poor posture most of the day at work, or OA in lumbar region. Recommend patient f/u w/ PT and try OTC pain meds. -DG lumbar  -PT referral -OTC pain meds (salonpass)

## 2022-04-23 NOTE — Progress Notes (Signed)
SUBJECTIVE:   CHIEF COMPLAINT / HPI:   Hip/lower back on right Has been an issue for at least a year. Bending and lifting make back pain worse. Pain is an issue daily all day. Tylenol doesn't seem to be helping was taking 4 a day. Standing too long can also cause issue. Denies any falls or weakness.     Left knee Has been an issue for 2 months, pain shoots down leg and he can't walk on it at times. Denies any swelling or trauma. Happens the most at work, when he's standing, but often has to sit down. Pain is pulsating in nature, denies any buckling or weakness.    PERTINENT  PMH / PSH: HTN  Patient Care Team: Bess Kinds, MD as PCP - General (Family Medicine) OBJECTIVE:  BP (!) 144/99   Pulse 87   Ht  (1.702 m)   Wt 153 lb 6.4 oz (69.6 kg)   SpO2 100%   BMI 24.03 kg/m  Physical Exam Constitutional:      General: He is not in acute distress.    Appearance: Normal appearance. He is not ill-appearing.  Musculoskeletal:     Lumbar back: Tenderness present. No swelling, edema, deformity, signs of trauma, spasms or bony tenderness. Normal range of motion. Negative right straight leg raise test.     Right hip: Tenderness and bony tenderness present. No deformity. Decreased range of motion. Normal strength.     Right knee: Normal.     Left knee: No swelling, deformity, effusion, erythema, ecchymosis, lacerations, bony tenderness or crepitus. Normal range of motion. No tenderness.  Neurological:     Mental Status: He is alert.      ASSESSMENT/PLAN:  Pain of right hip Assessment & Plan: Patient notes right hip pain that's been an issues for over a year. He notes pain w/ bending over, or walking. Pain radiates to his low back on the right. Pain rated 6-8/10 and is affecting his walking/ability to get up from seated. Hip ROM limited 2/2 to pain, could not lift leg past 30 degrees from horizontal. Given chronicity of symptoms, most concerned for OA. Less concerned for septic  joint or sciatica given timing of symptoms, and lack of tingling/numbness and negative SLR. Will image hip and recommend patient f/u w/ PT. -DG right hip -PT referral -OTC pain meds (salonpas)  Orders: -     DG HIP UNILAT W OR W/O PELVIS 2-3 VIEWS RIGHT; Future -     Ambulatory referral to Physical Therapy  Right-sided low back pain without sciatica, unspecified chronicity Assessment & Plan: Patient w/ lumbar back pain for going on a year, also notes his posture has been worse since his abdominal surgery. Pain worse w/ activity/bending/walking/standing for too long. 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 Lumbar region TTP, distal to paraspinal muscles. No foci of tension/spasm, less likely the cause. Patient may be having referred pain from his hip, or muscles weakness/strain given his poor posture most of the day at work, or OA in lumbar region. Recommend patient f/u w/ PT and try OTC pain meds. -DG lumbar  -PT referral -OTC pain meds (salonpass)  Orders: -     DG Lumbar Spine Complete; Future -     Ambulatory referral to Physical Therapy  Acute pain of left knee Assessment & Plan: Patient notes pain in left knee for last 2 months, worse w/ activity and standing. Pain comes on suddenly and prevents him from walking/standing. He has  to take a seat to deal with pain. Denies any trauma. Knee exam completely normal w/o TTP, or weakness. Low concern for septic joint given chronicity, mostly concerned about OA. Also considered referred pain from hip, but will hold off on left hip imaging at this time. Patient noting that w/ leg/back/hip pain it is affecting him greatly and he wants to discuss FMLA at next visit. -DG Left knee complete -OTC pain meds (voltaren gel) -PT referral  Orders: -     DG Knee Complete 4 Views Left; Future -     Ambulatory referral to Physical Therapy   No follow-ups on file. Bess Kinds, MD 04/23/2022, 5:16 PM PGY-2, [place]  [facility]

## 2022-04-23 NOTE — Patient Instructions (Addendum)
It was great to see you! Thank you for allowing me to participate in your care!  I recommend that you always bring your medications to each appointment as this makes it easy to ensure we are on the correct medications and helps [place] not miss when refills are needed.  Our plans for today:  - Hip/Back pain  X-ray imaging   [facility]   Address: [address] [place], [place], [place] [address] Phone: [phone] Physical Therapy  Someone will call you about setting up Salonpass (Lidocaine patches)  To back once a day, can wear for 12 hours  Can get over the counter in pharmacy - Left Knee Pain  X-ray imaging  Physical Therapy  Voltaren Gel, Can get over the counter in pharmacy  Take care and seek immediate care sooner if you develop any concerns.   Dr. Bess Kinds, MD [facility]

## 2022-04-23 NOTE — Assessment & Plan Note (Addendum)
Patient notes pain in left knee for last 2 months, worse w/ activity and standing. Pain comes on suddenly and prevents him from walking/standing. He has to take a seat to deal with pain. Denies any trauma. Knee exam completely normal w/o TTP, or weakness. Low concern for septic joint given chronicity, mostly concerned about OA. Also considered referred pain from hip, but will hold off on left hip imaging at this time. Patient noting that w/ leg/back/hip pain it is affecting him greatly and he wants to discuss FMLA at next visit. -DG Left knee complete -OTC pain meds (voltaren gel) -PT referral

## 2022-04-23 NOTE — Assessment & Plan Note (Signed)
Patient notes right hip pain that's been an issues for over a year. He notes pain w/ bending over, or walking. Pain radiates to his low back on the right. Pain rated 6-8/10 and is affecting his walking/ability to get up from seated. Hip ROM limited 2/2 to pain, could not lift leg past 30 degrees from horizontal. Given chronicity of symptoms, most concerned for OA. Less concerned for septic joint or sciatica given timing of symptoms, and lack of tingling/numbness and negative SLR. Will image hip and recommend patient f/u w/ PT. -DG right hip -PT referral -OTC pain meds (salonpas)

## 2022-05-01 ENCOUNTER — Ambulatory Visit: Payer: Self-pay | Admitting: Student

## 2022-05-01 ENCOUNTER — Ambulatory Visit: Payer: BC Managed Care – PPO | Admitting: Student

## 2022-05-01 ENCOUNTER — Encounter: Payer: Self-pay | Admitting: Student

## 2022-05-01 VITALS — BP 112/87 | HR 68 | Ht 67.0 in | Wt 149.0 lb

## 2022-05-01 DIAGNOSIS — Z131 Encounter for screening for diabetes mellitus: Secondary | ICD-10-CM | POA: Diagnosis not present

## 2022-05-01 DIAGNOSIS — Z113 Encounter for screening for infections with a predominantly sexual mode of transmission: Secondary | ICD-10-CM

## 2022-05-01 DIAGNOSIS — F419 Anxiety disorder, unspecified: Secondary | ICD-10-CM | POA: Diagnosis not present

## 2022-05-01 DIAGNOSIS — F320 Major depressive disorder, single episode, mild: Secondary | ICD-10-CM | POA: Diagnosis not present

## 2022-05-01 DIAGNOSIS — Z122 Encounter for screening for malignant neoplasm of respiratory organs: Secondary | ICD-10-CM

## 2022-05-01 DIAGNOSIS — Z111 Encounter for screening for respiratory tuberculosis: Secondary | ICD-10-CM

## 2022-05-01 DIAGNOSIS — Z13220 Encounter for screening for lipoid disorders: Secondary | ICD-10-CM

## 2022-05-01 DIAGNOSIS — Z1159 Encounter for screening for other viral diseases: Secondary | ICD-10-CM

## 2022-05-01 MED ORDER — SERTRALINE HCL 50 MG PO TABS: 50.0000 mg | ORAL_TABLET | Freq: Every day | ORAL | 1 refills | Status: DC

## 2022-05-01 NOTE — Patient Instructions (Addendum)
It was great to see you! Thank you for allowing me to participate in your care!  I recommend that you always bring your medications to each appointment as this makes it easy to ensure we are on the correct medications and helps [place] not miss when refills are needed.  Our plans for today:  - Depression  Starting you on Zoloft 50 mg daily  I will see you in 2 weeks for follow up appointment  We will collect labs at this visit  - FMLA Bring paperwork to office next week, and I will work on it. I'll call you if I need your help filling out  - X-Rays  Go to:  [facility] : [facility]  [address] [place]  Phone: [phone]   - Labs for Check up in future  Checking your:   A1c for diabetes   Lipid panel for cholesterol   Sexually Transmitted Infection testing   Testing for lung infection called Tuberculosis   Testing for Hepatitis B a liver infection   Lung Cancer Screening Imaging    Therapy and Counseling Resources  For information on therapists, please go to [email or web address]. You can also contact your insurance company to find an Hospital doctor.   When you call, ask if they take your insurance.  [facility] (takes children) Location 1: [address] [place], [place] [address] Location 2: [address] [place], [place] [address] [phone]   [facility] (spanish speaking therapist available)(habla espanol)(take medicare and medicaid)  [address], [place], [place] [address], [email or web address] [email or web address] [phone]  [facility]Psychiatry and Counseling [address] [facility] [place] [place], [place] [address] [phone]  [facility]   [address], [address] [place], [place] [address]      [phone]  [facility] (spanish available) [address]  [place], [place] [address] [phone]  [facility] (take [facility] and medicare) [address]  [place], [place] [address]        [phone]     [facility] (virtual only) [phone]  [facility]ov[facility]ussma[facility] [address] [place], [place] [phone]  [facility]:  [address] [place] [phone]  [facility]:  [address] [place]iener [phone]  [facility] (under & uninsured) [address]   [place] [phone]    [email or web address]    [facility] [address]  [place]    [phone]  [facility] -[address]     Phone:  [phone]     [facility]-  [address]  [phone]   [facility] [address]      [place], [place] [phone]  [facility]: [address] [place], [place], [place]  [phone]   [facility] (Spanish therapist) [email or web address]  [address] [place]  [address]   [place] [address]    [phone]    [facility]   [address],  [place], [place]  [phone]   [facility]  [address] [place]  [phone]  [facility]  [address] [place], [place]        [phone]  Open Access/Walk In Clinic under & uninsured  [facility]  [address] [place], [place] [phone] Crisis [phone]  [facility] [place],  [place]  Spanish)   [address] [place]: [phone]) 8:30 - 12; 1 - 2:30  [facility] [employer],  [address] [place]    [phone]):8:30 - 12; 2 - 3PM  [facility] [employer],  [address]  [place]; [phone]):   Mon - Fri 8 AM - 5 PM  [facility] [address] [place]  MWF 12:30 to 3:00 or call to schedule an appointment  [phone]  Specific Provider options Psychology Today  [email or web address] click on find a  therapist  enter your zip code left side and select or tailor a therapist for your specific need.   [facility] Provider Directory [email or web address]  (Medicaid)   Follow all drop down to find a provider  Social Support program [facility] [place] [phone] or PhotoSolver.pl [address], [place], [place] Recovery support and educational   24- Hour Availability:   [facility]  [address] [place], [place] [phone] Crisis [phone]  [facility] [phone]  [place] [facility]  [phone]   [facility]  [phone] (after hours)  Therapeutic Alternative/Mobile Crisis   [phone]  [place] National Suicide Hotline  862-834-2017 Len Childs)  Call 911 or go to emergency room  [facility]  [phone]);  [place] and [employer]  [phone]); [place], [place], [place], [place], [place], [place], [place]   Take care and seek immediate care sooner if you develop any concerns.   Dr. Bess Kinds, MD [facility]

## 2022-05-01 NOTE — Progress Notes (Signed)
SUBJECTIVE:   Chief compliant/HPI: annual examination  Anthony Randolph is a 52 y.o. who presents today for an annual exam.   Depression Patient notes issue of low mood, and interest in things since his surgery. Feels like his health wasn't taken seriously leading up to the surgery and that caused him to need surgery. Denies any SI/HI/hallucinations. Scores high on PHQ 9, q/ 15, having symptoms somewhat often through a few days a week.   OBJECTIVE:   BP 112/87   Pulse 68   Ht 5\' 7"  (1.702 m)   Wt 149 lb (67.6 kg)   SpO2 98%   BMI 23.34 kg/m   Physical Exam Constitutional:      General: He is not in acute distress.    Appearance: Normal appearance. He is not ill-appearing.  Cardiovascular:     Rate and Rhythm: Normal rate and regular rhythm.     Pulses: Normal pulses.     Heart sounds: Normal heart sounds. No murmur heard.    No friction rub. No gallop.  Pulmonary:     Effort: Pulmonary effort is normal. No respiratory distress.     Breath sounds: Normal breath sounds. No stridor. No wheezing, rhonchi or rales.  Abdominal:     General: Abdomen is flat. Bowel sounds are normal. There is no distension.     Palpations: Abdomen is soft.     Tenderness: There is abdominal tenderness.  Neurological:     Mental Status: He is alert.  Psychiatric:        Mood and Affect: Mood normal.        Behavior: Behavior normal.        Thought Content: Thought content normal.      ASSESSMENT/PLAN:   Depression, major, single episode, mild (HCC) Patient notes an issue w/ depression since he had to have his abdominal surgery. Has many complaints about being heard as a patient, and is in pain from the scar in his abdomen. He reports low mood and is wanting to start therapy/medication. He denies any SI/HI/Hallucinations.  -Zoloft 50 mg -Therapy resources given -Follow up 2 weeks  Anxiety Patient scores high on GAD-7 (15) also finding his anxiety making it hard to be at work/home. Also  noted high scoring for depression on PHQ-9 -Therapy Resources given -Zoloft 50 mg daily -F/u 2 weeks    Annual Examination  See AVS for age appropriate recommendations.  Mood: noting low mood 2/2 social stresssors, pain, and abdominal surgery hx. Wanting to start medication. Will start Zoloft 50 mg and therapy  Blood pressure value is at goal, discussed.   Considered the following screening exams based upon USPSTF recommendations: Diabetes screening: discussed and ordered Screening for elevated cholesterol: discussed and ordered HIV testing: discussed and ordered Neg 06/23/21 Hepatitis C: discussed Neg 06/12/20 Hepatitis B: discussed and ordered Syphilis if at high risk: discussed and ordered Reviewed risk factors for latent tuberculosis and ordered Colorectal cancer screening: up to date on screening for CRC. Next due 11/20/31 Lung cancer screening:  Ordered  See documentation below regarding discussion and indication.  Tobacco use: Has been smoking for 20 years 2 packs a week. 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 Patient to drop off paperwork next week, ask that I call if need help filling out.   Labs Lab closed at last vist,  patient needing labs drawn for physical at next visit, follow up for depression.  Follow up in 1 year or sooner if indicated.    Bess Kinds, MD [facility]

## 2022-05-03 ENCOUNTER — Other Ambulatory Visit: Payer: Self-pay | Admitting: Student

## 2022-05-03 DIAGNOSIS — F419 Anxiety disorder, unspecified: Secondary | ICD-10-CM | POA: Insufficient documentation

## 2022-05-03 DIAGNOSIS — F320 Major depressive disorder, single episode, mild: Secondary | ICD-10-CM | POA: Insufficient documentation

## 2022-05-03 DIAGNOSIS — N529 Male erectile dysfunction, unspecified: Secondary | ICD-10-CM

## 2022-05-03 NOTE — Assessment & Plan Note (Signed)
Patient notes an issue w/ depression since he had to have his abdominal surgery. Has many complaints about being heard as a patient, and is in pain from the scar in his abdomen. He reports low mood and is wanting to start therapy/medication. He denies any SI/HI/Hallucinations.  -Zoloft 50 mg -Therapy resources given -Follow up 2 weeks

## 2022-05-03 NOTE — Assessment & Plan Note (Addendum)
Patient scores high on GAD-7 (15) also finding his anxiety making it hard to be at work/home. Also noted high scoring for depression on PHQ-9 -Therapy Resources given -Zoloft 50 mg daily -F/u 2 weeks

## 2022-05-11 ENCOUNTER — Inpatient Hospital Stay: Admission: RE | Admit: 2022-05-11 | Payer: BC Managed Care – PPO | Source: Ambulatory Visit

## 2022-05-14 ENCOUNTER — Other Ambulatory Visit (HOSPITAL_COMMUNITY)
Admission: RE | Admit: 2022-05-14 | Discharge: 2022-05-14 | Disposition: A | Discharge status: Home / Self Care | Payer: BC Managed Care – PPO | Source: Ambulatory Visit | Attending: Family Medicine | Admitting: Family Medicine

## 2022-05-14 ENCOUNTER — Ambulatory Visit (INDEPENDENT_AMBULATORY_CARE_PROVIDER_SITE_OTHER): Payer: BC Managed Care – PPO | Admitting: Student

## 2022-05-14 ENCOUNTER — Encounter: Payer: Self-pay | Admitting: Student

## 2022-05-14 VITALS — BP 118/84 | HR 72 | Ht 67.0 in | Wt 150.4 lb

## 2022-05-14 DIAGNOSIS — Z111 Encounter for screening for respiratory tuberculosis: Secondary | ICD-10-CM | POA: Diagnosis not present

## 2022-05-14 DIAGNOSIS — Z1159 Encounter for screening for other viral diseases: Secondary | ICD-10-CM

## 2022-05-14 DIAGNOSIS — Z113 Encounter for screening for infections with a predominantly sexual mode of transmission: Secondary | ICD-10-CM

## 2022-05-14 DIAGNOSIS — Z131 Encounter for screening for diabetes mellitus: Secondary | ICD-10-CM | POA: Diagnosis not present

## 2022-05-14 DIAGNOSIS — M545 Low back pain, unspecified: Secondary | ICD-10-CM

## 2022-05-14 DIAGNOSIS — M25551 Pain in right hip: Secondary | ICD-10-CM

## 2022-05-14 DIAGNOSIS — F320 Major depressive disorder, single episode, mild: Secondary | ICD-10-CM

## 2022-05-14 DIAGNOSIS — G8929 Other chronic pain: Secondary | ICD-10-CM

## 2022-05-14 DIAGNOSIS — M25562 Pain in left knee: Secondary | ICD-10-CM

## 2022-05-14 DIAGNOSIS — Z13220 Encounter for screening for lipoid disorders: Secondary | ICD-10-CM

## 2022-05-14 LAB — POCT GLYCOSYLATED HEMOGLOBIN (HGB A1C): Hemoglobin A1C: 5.5 % (ref 4.0–5.6)

## 2022-05-14 MED ORDER — SERTRALINE HCL 100 MG PO TABS
100.0000 mg | ORAL_TABLET | Freq: Every day | ORAL | 1 refills | Status: DC
Start: 2022-05-14 — End: 2023-04-19
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
## 2022-05-14 NOTE — Patient Instructions (Addendum)
It was great to see you! Thank you for allowing me to participate in your care!  I recommend that you always bring your medications to each appointment as this makes it easy to ensure we are on the correct medications and helps [place] not miss when refills are needed.  Our plans for today:  - Depression/Anxiety   It's doing about the same, so we will increase the dose of your medicine   Zoloft 100 mg daily   (Can take 2 of the 50 mg pills, if you still have some)   Make a follow up appointment in 2 weeks - Health Maintenance  We are checking some labs from your last check up. I will call you with results Testing for: Sexually transmitted infections, Hepatitis (B and C), high cholesterol, diabetes, and Tuberculosis (lung infection)  -FMLA  I will start filling this out when I get the paperwork, will call you with any questions  Back/Knee/Hip pain -Consider using lidocaine patches in these areas, can use for 12 hours, once a day. Can also use Voltaren gel on knee/hip/back -Get imaging done at your convenience        We are checking some labs today, I will call you if they are abnormal will send you a MyChart message or a letter if they are normal.  If you do not hear about your labs in the next 2 weeks please let us know.  Take care and seek immediate care sooner if you develop any concerns.   Dr. Bess Kinds, MD [facility]

## 2022-05-15 LAB — URINE CYTOLOGY ANCILLARY ONLY
Candida Urine: NEGATIVE
Chlamydia: NEGATIVE
Comment: NEGATIVE
Comment: NEGATIVE
Comment: NORMAL
Neisseria Gonorrhea: NEGATIVE
Trichomonas: NEGATIVE

## 2022-05-15 LAB — LIPID PANEL: VLDL Cholesterol Cal: 12 mg/dL (ref 5–40)

## 2022-05-15 LAB — QUANTIFERON-TB GOLD PLUS

## 2022-05-16 LAB — QUANTIFERON-TB GOLD PLUS

## 2022-05-16 LAB — LIPID PANEL: Chol/HDL Ratio: 1.5 ratio (ref 0.0–5.0)

## 2022-05-16 LAB — HIV ANTIBODY (ROUTINE TESTING W REFLEX): HIV Screen 4th Generation wRfx: NONREACTIVE

## 2022-05-16 NOTE — Assessment & Plan Note (Signed)
Patient has not gotten imaging yet, but discussed importance of obtaining when possible. Patient agreeable and will start trying lidocaine patches/Voltaren gel. -lidocaine patches and Voltaren gel discussed.  

## 2022-05-16 NOTE — Assessment & Plan Note (Signed)
Depression unchanged. Patient still notes low mood with high PHQ-9 14, was 15 before. Patient on zoloft 2 weeks at lowest dose. Will increase dose. Patient has not found therapist yet but is willing to look.  -Zoloft 100 mg -F/u 2 weeks -Therapy discussed

## 2022-05-16 NOTE — Assessment & Plan Note (Signed)
Patient has not gotten imaging yet, but discussed importance of obtaining when possible. Patient agreeable and will start trying lidocaine patches/Voltaren gel. -lidocaine patches and Voltaren gel discussed.

## 2022-05-20 LAB — LIPID PANEL
Cholesterol, Total: 143 mg/dL (ref 100–199)
HDL: 95 mg/dL (ref >39)
LDL Chol Calc (NIH): 36 mg/dL (ref 0–99)
Triglycerides: 56 mg/dL (ref 0–149)

## 2022-05-20 LAB — T PALLIDUM ANTIBODY, EIA: T pallidum Antibody, EIA: NEGATIVE

## 2022-05-20 LAB — RPR W/REFLEX TO TREPSURE: RPR: NONREACTIVE

## 2022-05-20 LAB — QUANTIFERON-TB GOLD PLUS: QuantiFERON-TB Gold Plus: NEGATIVE

## 2022-05-20 LAB — HEPATITIS B SURFACE ANTIBODY, QUANTITATIVE: Hepatitis B Surf Ab Quant: <3.5 m[IU]/mL — ABNORMAL LOW (ref >9.9)

## 2022-05-29 ENCOUNTER — Ambulatory Visit: Payer: BC Managed Care – PPO | Attending: Family Medicine

## 2022-06-03 ENCOUNTER — Encounter: Payer: Self-pay | Admitting: Student

## 2022-06-10 ENCOUNTER — Telehealth: Payer: Self-pay | Admitting: Student

## 2022-06-10 NOTE — Telephone Encounter (Signed)
Called to inquire about patient's leave/FMLA paperwork.   Provider needing information to fill out paperwork: How long patient intends to be on leave? What capabilities patient has to work, with the pain he is having? How often patient is having episodes of pain (how many times in a week or day)? Is patient it looking to have a reduced schedule for a time period? If so, what does that look like for the patient? (How many hours a day can patient work/how many days a week can patient work)  Patient can answer these questions, and provider will be able to fill out paperwork.

## 2022-06-19 ENCOUNTER — Other Ambulatory Visit: Payer: Self-pay | Admitting: Student

## 2022-06-19 DIAGNOSIS — N529 Male erectile dysfunction, unspecified: Secondary | ICD-10-CM

## 2022-06-19 DIAGNOSIS — I10 Essential (primary) hypertension: Secondary | ICD-10-CM

## 2022-08-03 ENCOUNTER — Other Ambulatory Visit: Payer: Self-pay | Admitting: Student

## 2022-08-03 ENCOUNTER — Ambulatory Visit: Payer: BC Managed Care – PPO | Admitting: Student

## 2022-08-03 ENCOUNTER — Other Ambulatory Visit: Payer: Self-pay | Admitting: Family Medicine

## 2022-08-03 DIAGNOSIS — N529 Male erectile dysfunction, unspecified: Secondary | ICD-10-CM

## 2022-08-06 ENCOUNTER — Ambulatory Visit: Payer: BC Managed Care – PPO | Admitting: Student

## 2022-08-11 ENCOUNTER — Ambulatory Visit: Payer: BC Managed Care – PPO

## 2022-08-11 ENCOUNTER — Ambulatory Visit: Payer: BC Managed Care – PPO | Admitting: Student
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
## 2022-08-14 ENCOUNTER — Encounter: Payer: Self-pay | Admitting: Family Medicine

## 2022-11-03 ENCOUNTER — Other Ambulatory Visit: Payer: Self-pay | Admitting: *Deleted

## 2022-11-03 DIAGNOSIS — N529 Male erectile dysfunction, unspecified: Secondary | ICD-10-CM

## 2022-11-05 MED ORDER — SILDENAFIL CITRATE 100 MG PO TABS
ORAL_TABLET | ORAL | 0 refills | Status: DC
Start: 2022-11-05 — End: 2023-01-29

## 2023-01-10 IMAGING — DX DG ABDOMEN 1V
1 series · 1 of 1 positions shown · non-contrast
Comparison: None.

CLINICAL DATA: Right lower quadrant abdominal pain

EXAM:
ABDOMEN - 1 VIEW

[abdomen kub]
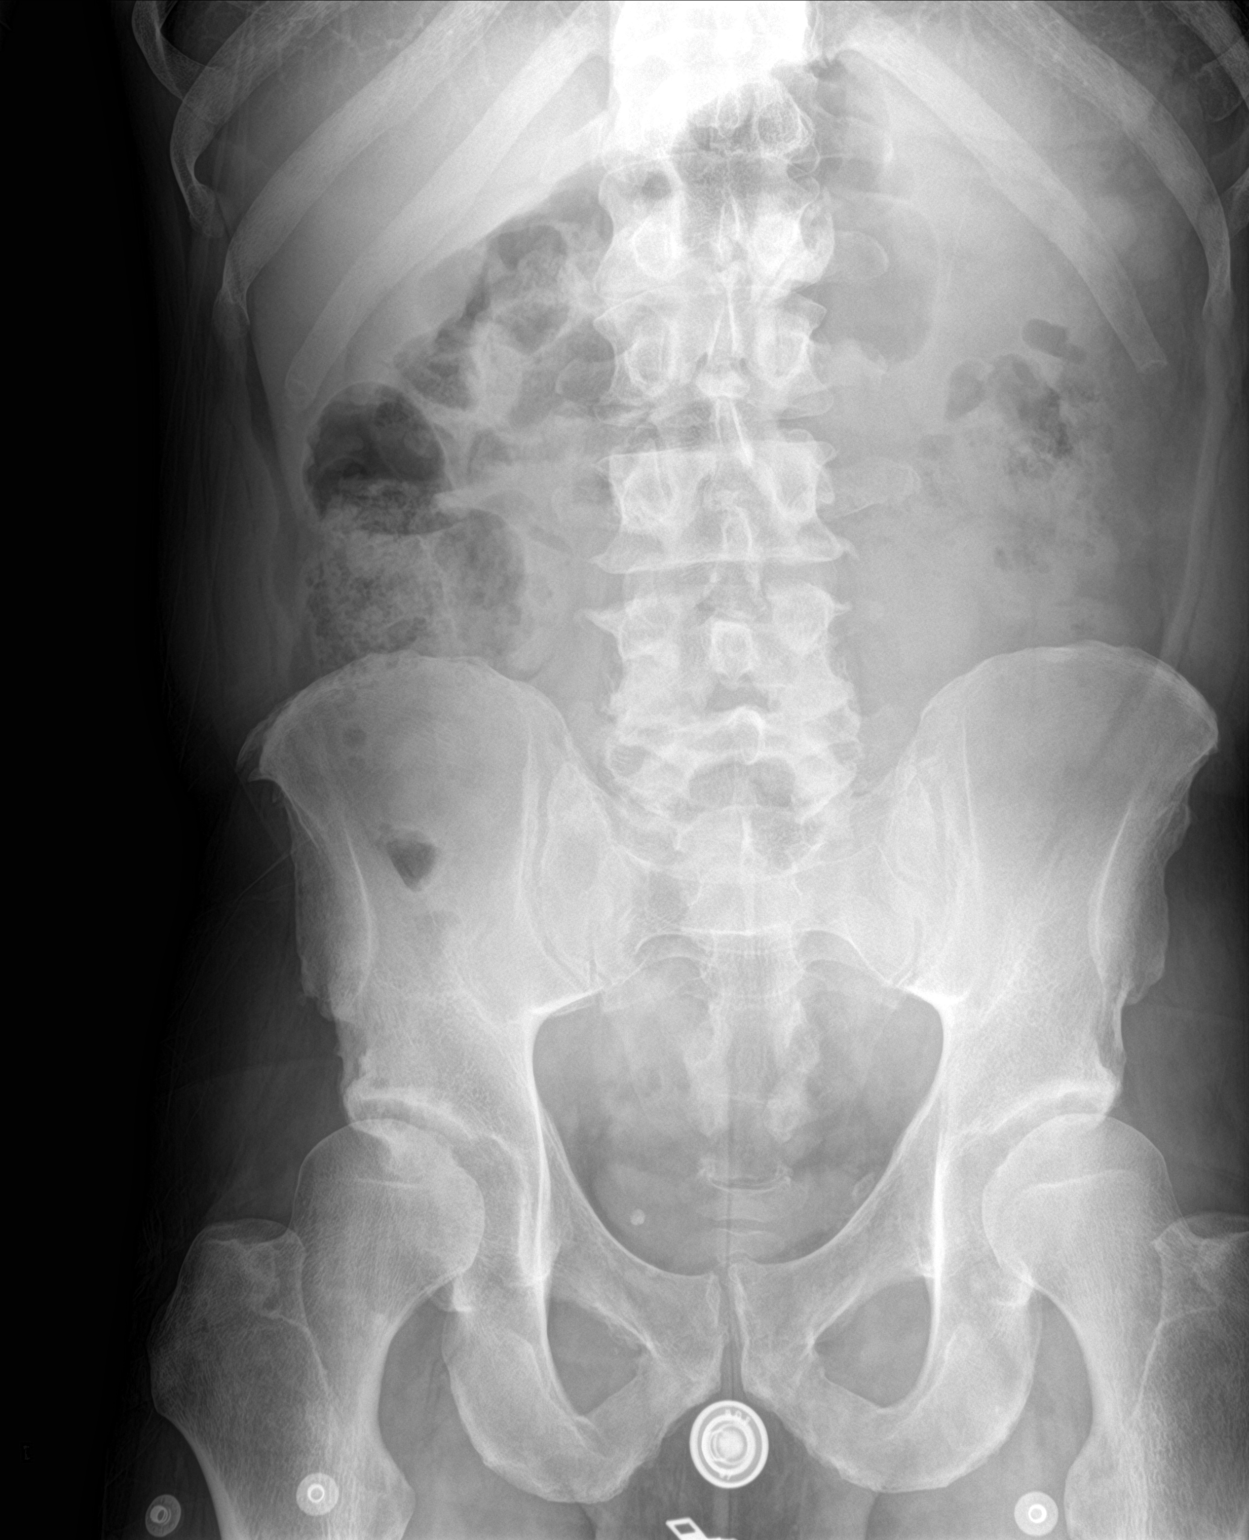

[1 of 1 positions shown; findings below may reference images not displayed]

FINDINGS: No significant bowel dilatation, ileus pattern, or obstruction.
Degenerative changes of the spine and hips. Renal shadows are
obscured by stool and gas pattern. Nonspecific right hemipelvis
round subcentimeter calcification noted.
IMPRESSION: No definite acute finding by plain radiography.

Degenerative changes of the spine and hips.

## 2023-01-29 ENCOUNTER — Other Ambulatory Visit: Payer: Self-pay | Admitting: Student

## 2023-01-29 DIAGNOSIS — N529 Male erectile dysfunction, unspecified: Secondary | ICD-10-CM

## 2023-02-02 ENCOUNTER — Other Ambulatory Visit: Payer: Self-pay

## 2023-02-03 MED ORDER — TRIAMCINOLONE ACETONIDE 0.5 % EX OINT: TOPICAL_OINTMENT | Freq: Two times a day (BID) | CUTANEOUS | 0 refills | Status: DC

## 2023-03-01 ENCOUNTER — Telehealth: Payer: Self-pay | Admitting: Student

## 2023-03-01 NOTE — Telephone Encounter (Signed)
 Patient is requesting for doctor to call him regarding his FMLA he is needing it re certified ASAP.  Please Advise.   Thanks!

## 2023-03-05 NOTE — Telephone Encounter (Signed)
 Called patient and discussed. Looks like Dr. Barbaraann Faster had tried to set this up back in 06/2022 but something fell through.  Patient has follow-up with [facility] on 03/09/23. Advised to have his employer go ahead and fax FMLA paperwork so that this can be filled out ASAP after this visit.  Eliezer Mccoy, MD

## 2023-03-09 ENCOUNTER — Ambulatory Visit: Payer: BC Managed Care – PPO | Admitting: Student

## 2023-03-09 ENCOUNTER — Ambulatory Visit (INDEPENDENT_AMBULATORY_CARE_PROVIDER_SITE_OTHER): Admitting: Student

## 2023-03-09 VITALS — BP 160/95 | HR 97 | Ht 69.0 in | Wt 154.8 lb

## 2023-03-09 DIAGNOSIS — R109 Unspecified abdominal pain: Secondary | ICD-10-CM

## 2023-03-09 NOTE — Progress Notes (Signed)
  SUBJECTIVE:   CHIEF COMPLAINT / HPI:   Left leg pain: Starts at his shin and will reach down to his foot. He cannot straighten up because of his back. He feels like a rocking chair. He has not had imaging of the low back or his leg. He has been having this pain for a year. He has tried biofreeze at recommendation from Dr. Barbaraann Faster. None of it worked. Tylenol and ibuprofen has not been beneficial.  Abdominal pain: surgery (appendectomy) in June 2023. He has had continued abdominal pain since his surgery.  Lower back pain: fell recently ~2 weeks ago. 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 He also fell at home Indentations in his hand: not discussed He is requesting referrals today because he does not have confidence in our clinic.   PERTINENT  PMH / PSH: HTN, appendicitis  OBJECTIVE:  BP (!) 160/95   Pulse 97   Ht 5\' 9"  (1.753 m)   Wt 154 lb 12.8 oz (70.2 kg)   SpO2 99%   BMI 22.86 kg/m  None performed  ASSESSMENT/PLAN:   During encounter, patient was frustrated at number of visits he has needed to have his medical problems addressed.  He is also frustrated by his visits needing to be canceled.  He was changed to my schedule this afternoon due to provider being out.  He does not want workup to be provided by [facility] clinic staff nor myself.  He requests referrals for all of his concerns, see above.  While discussing low back pain, he states he fell at work and then proceeded to say he fell at home.  While I tried to clarify this, patient said "well if you were listening, I said I fell at work twice".  I asked patient to please not give me attitude and that I was trying to assist him the best I could.  He proceeded to stand up, commanded me to give him his referrals and walked out of clinic.  I discussed this matter with my superior Dr. Manson Passey.  I have forwarded this encounter to clinic administration to handle further for inappropriate patient interaction.  No orders placed, no referrals placed, no  charge visit.  Shelby Mattocks, DO 03/09/2023, 3:06 PM PGY-3, [facility]
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
## 2023-03-11 ENCOUNTER — Telehealth: Payer: Self-pay | Admitting: Family Medicine

## 2023-03-11 NOTE — Telephone Encounter (Signed)
 I and the practice administrator attempted to reach this patient regarding a recent clinic encounter with Dr. Royal Piedra.  HIPAA compliant callback message left.

## 2023-04-19 ENCOUNTER — Ambulatory Visit: Admitting: Student

## 2023-04-19 ENCOUNTER — Other Ambulatory Visit: Payer: Self-pay | Admitting: Student

## 2023-04-19 ENCOUNTER — Telehealth: Payer: Self-pay | Admitting: Student

## 2023-04-19 ENCOUNTER — Encounter: Payer: Self-pay | Admitting: Student

## 2023-04-19 VITALS — BP 140/111 | HR 80 | Ht 68.0 in | Wt 152.6 lb

## 2023-04-19 DIAGNOSIS — G8929 Other chronic pain: Secondary | ICD-10-CM

## 2023-04-19 DIAGNOSIS — N529 Male erectile dysfunction, unspecified: Secondary | ICD-10-CM

## 2023-04-19 DIAGNOSIS — M79641 Pain in right hand: Secondary | ICD-10-CM | POA: Diagnosis not present

## 2023-04-19 DIAGNOSIS — F320 Major depressive disorder, single episode, mild: Secondary | ICD-10-CM

## 2023-04-19 DIAGNOSIS — I10 Essential (primary) hypertension: Secondary | ICD-10-CM

## 2023-04-19 MED ORDER — SERTRALINE HCL 100 MG PO TABS: 100.0000 mg | ORAL_TABLET | Freq: Every day | ORAL | 3 refills | Status: AC

## 2023-04-19 MED ORDER — SERTRALINE HCL 50 MG PO TABS: 50.0000 mg | ORAL_TABLET | Freq: Every day | ORAL | 0 refills | Status: AC

## 2023-04-19 NOTE — Assessment & Plan Note (Addendum)
 Patient comes in to follow-up with depression, notes that he has been feeling down and depressed, has not been on medication for 2 to 3 months.  Patient reported Zoloft was helping in the past, he would like to restart.  Patient also interested in getting therapy resources.  Will restart Zoloft, with follow-up, and therapy resources. - Zoloft 100 mg daily - Therapy resources given - Follow-up 1 month

## 2023-04-19 NOTE — Assessment & Plan Note (Addendum)
 Patient comes in for follow-up of his chronic pain, located in his lower back, right hip and left knee.  Patient reports difficulty at work secondary to his pain, and is wanting to fill out FMLA paperwork.  Will obtain imaging, and have patient follow-up with sports medicine for evaluation.  Suspect patient has arthritis in back, and may be knee, causing pain/off balance and issues, that may be affecting the hip joint.  Low concern for infection, or trauma, given history. - DG lumbar - DG right hip - DG left knee - Follow-up sports medicine - Lidocaine patches for pain - Ibuprofen/Tylenol as needed

## 2023-04-19 NOTE — Assessment & Plan Note (Addendum)
 Patient comes in with 27-month history of pain in his right hand.  Patient reports pain usually fine, but whenever he is extending his hand, or bending over to tie his shoes he feels a sharp shooting pain in the center of his hand.  Patient denies any history of trauma, hand exam benign, no tenderness to palpation, but does complain of pain with stretching arm/hand.  Unsure cause of patient's pain, suspect there might be inflammation in muscles of hand, low concern for trauma, possibly considering arthritis, low concern for carpal tunnel syndrome given negative exam.  Will obtain imaging and have patient follow-up with sports medicine. - DG right hand - Follow-up with sports medicine

## 2023-04-19 NOTE — Addendum Note (Signed)
 Addended by: Wilhemena Harbour T on: 04/19/2023 03:28 PM   Modules accepted: Orders

## 2023-04-19 NOTE — Patient Instructions (Addendum)
 It was great to see you! Thank you for allowing me to participate in your care!  I recommend that you always bring your medications to each appointment as this makes it easy to ensure we are on the correct medications and helps [place] not miss when refills are needed.  Our plans for today:  - Pain - We are going to get imaging of your hand, back, knee, and hip. Go obtain the imaging at your convenience, you do not need an appointment. Ideally obtain before Friday     [facility]   Address: [address] [place], [place], [place] [address] Phone: [phone]  - Follow up with Sport's Medicine for Ultrasound of hand and exercise to help with pain. Schedule an appointment at your convenience, you do not need a referral.    [facility] Address: [address] [place], [place], [place] [address] Phone: [phone]  Use Lidocaine patches as needed for pain relief (12 hours on, and 12 hours off)  - FMLA Re-send FMLA paperwork  - Depression  Restart Zoloft 100 mg daily  Therapy Resources below  Follow up 1 month  - High Blood Pressure  Take amlodipine 10 mg daily  Take losartan 100 mg daily   Other Issues Make follow up appointment for Check up / Derm visit  Take care and seek immediate care sooner if you develop any concerns.   Dr. Bess Kinds, MD [facility]   For information on therapists, please go to [email or web address]. You can also contact your insurance company to find an Hospital doctor.    Therapy and Counseling Resources Patients with commercial insurance or Medicare should contact their insurance company to get a list of in network providers.  [facility] (takes children) Location 1: [address] [place], [place] [address] Location 2: [address] [place], [place] [address] [phone]   [facility] (spanish speaking therapist available)(habla espanol)(take medicare and medicaid)  [address],  [place], [place] [address], [email or web address] [phone]  [facility] [address] [place], [place] [address] [phone]  [facility]   [address] Suite [place], [place] [address]      [phone]  [facility] (spanish available) [address]  [place], [place] [address] [phone]  [facility] (take [facility] and medicare) [address], [address]  [place], [place] [address]       [phone]     [facility] (virtual only) [phone]  [facility]ovita Kussmaul [facility] [address] [place], [place] [phone]  [facility]:  [address] [place] [phone]  [facility]:  [address] [place] [phone]  [facility] (under & uninsured) [address]   [place] [phone]    [email or web address]    [facility] [phone] [address]  [place]    [phone]  [facility] -[address]     Phone:  [phone]     [facility]-  [address]  [phone]   [facility] [address]      [place], [place] [phone]  [facility]: [address] [place], [place], [place]  [phone]   [facility] (Spanish therapist) [email or web address]  [address] [place]  [address]   [place] [address]    [phone]    [facility]   [address],  [place], [place]  [phone]   [facility]  [address] [place]  [phone]  [facility]  [address] [place], [place]        [phone]  Open Access/Walk In Clinic under & uninsured  [facility]  [address] [place], [place] [phone] Crisis [phone]  [facility] [address],  (Spanish)   [address] , [place]: [phone]) 8:30 - 12; 1 - 2:30  [facility] [employer],  [address] [place], [place]    [phone]):8:30 - 12; 2 - 3PM  [facility] [employer],  [address],  [place]; [phone]):   Mon - Fri 8 AM - 5 PM  [facility] [address] [place]  MWF 12:30 to 3:00 or call to schedule an appointment  [phone]  Specific Provider options Psychology Today  [email or web address]/us  click on find a therapist  enter your zip code left side and select or tailor a therapist for your specific need.   [facility] Provider Directory [email or web address]  (Medicaid)   Follow all drop down to find a provider  Social Support program [facility] [place] [phone] or PhotoSolver.pl [address], [place], [place] Recovery support and educational   24- Hour Availability:   [facility]  [address] [place], [place] [phone] Crisis [phone]  [facility] [phone]  [place] [facility]  [phone]   [facility]  [phone] (after hours)  Therapeutic Alternative/Mobile Crisis   9342915884  [place]  National Suicide Hotline  ([phone])161-8476 Derrel Flies)  Call 911 or go to emergency room  [facility]  [phone]);  [place] and [employer]  [phone]); [place], [place], [place], [place], [place], [place], [place]

## 2023-04-19 NOTE — Telephone Encounter (Signed)
 Called and spoke with patient about starting zoloft at 50 mg for 7 days then transition up to 100 mg. Patient understood and in agreement.   Rx sent to pharmacy

## 2023-04-19 NOTE — Progress Notes (Signed)
 SUBJECTIVE:   CHIEF COMPLAINT / HPI:   Pain Patient having hip, back, knee, and hand pain. Hand pain for last 2 months, and the others have been an issue for 2-3 years or more.   Hand Pain  He has been experiencing sharp, needle-like pain in his right hand for the past two months. The pain occurs daily, particularly when stretching his hand or reaching for objects, and is described as 'sensational' and sharp. He noticed small indentations on his right hand, which are not present on the left. There is no history of trauma or injury to the hand. He has been using over-the-counter pain relievers such as Equate and Tylenol to manage the pain.  Knee Pain He also experiences numbness and pain in his left knee, ongoing for over two to three years. The knee pain worsens with standing and activity, and he describes episodes where the knee 'shuts down,' causing falls. He has fallen twice at work and once at home due to the knee giving out. There is no specific injury to the knee, but the pain radiates down his shin and under his foot.  Back Pain Additionally, he has chronic back pain, which he describes as having been present 'forever.' He recalls a fall at home where he landed on his back, which may have exacerbated the pain. The back pain is not tender to touch but worsens with standing and activity. There is no specific back injury prior to the fall.  HTN Meds: amlodipine 10 mg, losartan 100 mg Not taking BP meds  Depression Feeling low mood, not taking zoloft, want's refill and list of therapist. No SI or plans to harm self, has good support at home and loves his family, and believes they need him.    PERTINENT  PMH / PSH:   OBJECTIVE:  BP (!) 140/111   Pulse 80   Ht 5\' 8"  (1.727 m)   Wt 152 lb 9.6 oz (69.2 kg)   SpO2 99%   BMI 23.20 kg/m  Physical Exam Musculoskeletal:     Right hand: No swelling, deformity, lacerations, tenderness or bony tenderness. Normal range of motion. Normal  strength. Normal sensation.     Thoracic back: Normal.     Lumbar back: Normal. No swelling, edema, deformity, signs of trauma, lacerations, spasms, tenderness or bony tenderness. Normal range of motion. Negative right straight leg raise test and negative left straight leg raise test. No scoliosis.     Right hip: Tenderness and bony tenderness present. Decreased strength.     Comments: Tenderness in hand with outstretching       ASSESSMENT/PLAN:   Assessment & Plan Depression, major, single episode, mild (HCC) Patient comes in to follow-up with depression, notes that he has been feeling down and depressed, has not been on medication for 2 to 3 months.  Patient reported Zoloft was helping in the past, he would like to restart.  Patient also interested in getting therapy resources.  Will restart Zoloft, with follow-up, and therapy resources. - Zoloft 100 mg daily - Therapy resources given - Follow-up 1 month  Primary hypertension Patient comes in for follow-up for his blood pressure.  Patient notes that has not been taking his blood pressure medicine.  Blood pressure noted to be elevated.  Will encourage patient to take blood pressure medicine and follow-up. - Continue amlodipine 10 mg daily - Continue losartan 100 mg daily - Follow-up 2 weeks Other chronic pain Patient comes in for follow-up of his chronic pain, located in  his lower back, right hip and left knee.  Patient reports difficulty at work secondary to his pain, and is wanting to fill out FMLA paperwork.  Will obtain imaging, and have patient follow-up with sports medicine for evaluation.  Suspect patient has arthritis in back, and may be knee, causing pain/off balance and issues, that may be affecting the hip joint.  Low concern for infection, or trauma, given history. - DG lumbar - DG right hip - DG left knee - Follow-up sports medicine - Lidocaine patches for pain - Ibuprofen/Tylenol as needed Pain of right hand Patient  comes in with 9-month history of pain in his right hand.  Patient reports pain usually fine, but whenever he is extending his hand, or bending over to tie his shoes he feels a sharp shooting pain in the center of his hand.  Patient denies any history of trauma, hand exam benign, no tenderness to palpation, but does complain of pain with stretching arm/hand.  Unsure cause of patient's pain, suspect there might be inflammation in muscles of hand, low concern for trauma, possibly considering arthritis, low concern for carpal tunnel syndrome given negative exam.  Will obtain imaging and have patient follow-up with sports medicine. - DG right hand - Follow-up with sports medicine No follow-ups on file. Wilhemena Harbour, MD 04/19/2023, 11:43 AM PGY-3, [facility]

## 2023-04-19 NOTE — Assessment & Plan Note (Addendum)
 Patient comes in for follow-up for his blood pressure.  Patient notes that has not been taking his blood pressure medicine.  Blood pressure noted to be elevated.  Will encourage patient to take blood pressure medicine and follow-up. - Continue amlodipine 10 mg daily - Continue losartan 100 mg daily - Follow-up 2 weeks

## 2023-04-23 ENCOUNTER — Ambulatory Visit
Admission: RE | Admit: 2023-04-23 | Discharge: 2023-04-23 | Disposition: A | Discharge status: Home / Self Care | Source: Ambulatory Visit | Attending: Family Medicine | Admitting: Family Medicine

## 2023-04-23 DIAGNOSIS — M25562 Pain in left knee: Secondary | ICD-10-CM

## 2023-04-23 DIAGNOSIS — M545 Low back pain, unspecified: Secondary | ICD-10-CM

## 2023-04-23 DIAGNOSIS — M79641 Pain in right hand: Secondary | ICD-10-CM

## 2023-04-23 DIAGNOSIS — M25551 Pain in right hip: Secondary | ICD-10-CM

## 2023-05-27 ENCOUNTER — Telehealth: Payer: Self-pay | Admitting: Student

## 2023-05-27 NOTE — Telephone Encounter (Signed)
 Called to discuss FMLA with patient.  Patient requesting FMLA of partial time off every 2 weeks in case he is having pain episodes, and also reduced hours as needed, for pain episodes.  Patient reports he has not yet followed up with sports medicine but plans to soon.  Patient also reports he continues to have substantial leg pain/knee pain.  Patient scheduled for follow-up 06/08/2023

## 2023-06-08 ENCOUNTER — Ambulatory Visit: Payer: Self-pay | Admitting: Student

## 2023-06-08 NOTE — Patient Instructions (Incomplete)
 It was great to see you! Thank you for allowing me to participate in your care!  I recommend that you always bring your medications to each appointment as this makes it easy to ensure we are on the correct medications and helps us  not miss when refills are needed.  Our plans for today:  - Musculoskeletal Pain  F/u w/ [facility]'s Medicine Clinic -   We are checking some labs today, I will call you if they are abnormal will send you a MyChart message or a letter if they are normal.  If you do not hear about your labs in the next 2 weeks please let us  know.***  Take care and seek immediate care sooner if you develop any concerns.   Dr. Wilhemena Harbour, MD [facility]

## 2023-06-08 NOTE — Progress Notes (Deleted)
  SUBJECTIVE:   CHIEF COMPLAINT / HPI:   F/u [facility] pain -4/14 for back/knee/hip/hand > imaging showed OA in hand, Neg knee, Mod OA L5-S1, CAM type femoral acetabular impingement & acetabular dysplasia  PERTINENT  PMH / PSH: ***  OBJECTIVE:  There were no vitals taken for this visit. ***  ASSESSMENT/PLAN:   Assessment & Plan  No follow-ups on file. Wilhemena Harbour, MD 06/08/2023, 9:49 AM PGY-***, [facility] {    This will disappear when note is signed, click to select method of visit    :1}

## 2023-06-14 ENCOUNTER — Ambulatory Visit: Admitting: Student

## 2023-06-15 ENCOUNTER — Encounter: Payer: Self-pay | Admitting: *Deleted

## 2023-07-22 ENCOUNTER — Other Ambulatory Visit: Payer: Self-pay | Admitting: Student

## 2023-07-22 DIAGNOSIS — N529 Male erectile dysfunction, unspecified: Secondary | ICD-10-CM

## 2023-07-25 IMAGING — DX DG CHEST 1V PORT
1 series · 1 of 1 positions shown · non-contrast
Comparison: None Available.

CLINICAL DATA: Preoperative evaluation

EXAM:
PORTABLE CHEST 1 VIEW

[chest ap]
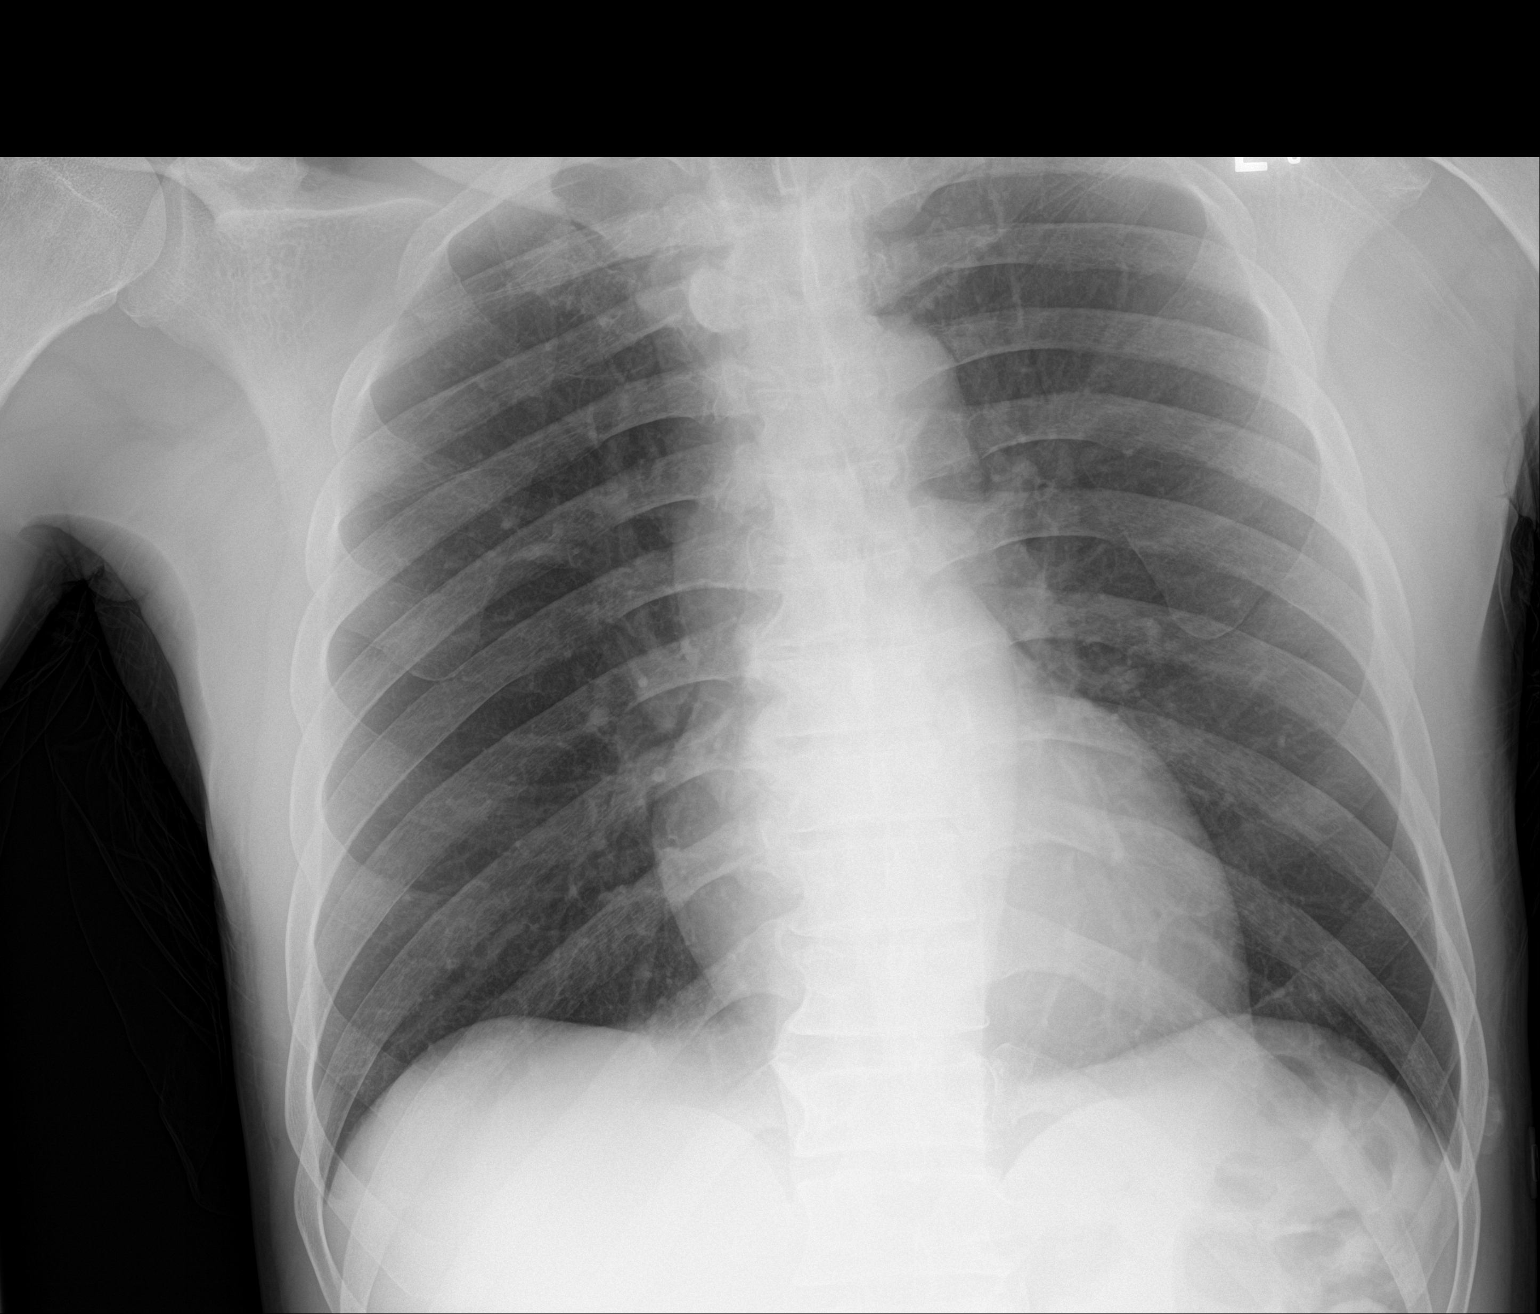

[1 of 1 positions shown; findings below may reference images not displayed]

FINDINGS: The heart size and mediastinal contours are within normal limits.
Both lungs are clear. No pleural effusion. The visualized skeletal
structures are unremarkable.
IMPRESSION: No acute process in the chest.

## 2023-09-30 ENCOUNTER — Other Ambulatory Visit: Payer: Self-pay | Admitting: *Deleted

## 2023-09-30 DIAGNOSIS — N529 Male erectile dysfunction, unspecified: Secondary | ICD-10-CM

## 2023-09-30 MED ORDER — TRIAMCINOLONE ACETONIDE 0.5 % EX OINT: TOPICAL_OINTMENT | Freq: Two times a day (BID) | CUTANEOUS | 0 refills | Status: DC

## 2023-09-30 MED ORDER — SILDENAFIL CITRATE 100 MG PO TABS: ORAL_TABLET | ORAL | 0 refills | Status: DC

## 2023-10-05 ENCOUNTER — Other Ambulatory Visit: Payer: Self-pay

## 2023-10-05 DIAGNOSIS — N529 Male erectile dysfunction, unspecified: Secondary | ICD-10-CM

## 2024-01-04 ENCOUNTER — Other Ambulatory Visit: Payer: Self-pay

## 2024-01-04 DIAGNOSIS — N529 Male erectile dysfunction, unspecified: Secondary | ICD-10-CM
